# Patient Record
Sex: Female | Born: 1975 | Race: Black or African American | Hispanic: No | Marital: Married | Smoking: Never smoker
Health system: Southern US, Community
[De-identification: ages and names within clinical notes are randomized; demographics above are authoritative.]

## PROBLEM LIST (undated history)

## (undated) DIAGNOSIS — K5900 Constipation, unspecified: Secondary | ICD-10-CM

## (undated) DIAGNOSIS — M2550 Pain in unspecified joint: Secondary | ICD-10-CM

## (undated) DIAGNOSIS — E785 Hyperlipidemia, unspecified: Secondary | ICD-10-CM

## (undated) DIAGNOSIS — M549 Dorsalgia, unspecified: Secondary | ICD-10-CM

## (undated) DIAGNOSIS — I10 Essential (primary) hypertension: Secondary | ICD-10-CM

## (undated) DIAGNOSIS — R7303 Prediabetes: Secondary | ICD-10-CM

## (undated) DIAGNOSIS — R0602 Shortness of breath: Secondary | ICD-10-CM

## (undated) DIAGNOSIS — G5602 Carpal tunnel syndrome, left upper limb: Secondary | ICD-10-CM

## (undated) DIAGNOSIS — R12 Heartburn: Secondary | ICD-10-CM

## (undated) HISTORY — PX: OTHER SURGICAL HISTORY: SHX169

## (undated) HISTORY — DX: Heartburn: R12

## (undated) HISTORY — DX: Hyperlipidemia, unspecified: E78.5

## (undated) HISTORY — PX: BREAST SURGERY: SHX581

## (undated) HISTORY — DX: Pain in unspecified joint: M25.50

## (undated) HISTORY — DX: Constipation, unspecified: K59.00

## (undated) HISTORY — DX: Dorsalgia, unspecified: M54.9

## (undated) HISTORY — DX: Prediabetes: R73.03

## (undated) HISTORY — DX: Shortness of breath: R06.02

## (undated) HISTORY — DX: Carpal tunnel syndrome, left upper limb: G56.02

---

## 2000-12-19 ENCOUNTER — Encounter (INDEPENDENT_AMBULATORY_CARE_PROVIDER_SITE_OTHER): Payer: Self-pay

## 2000-12-19 ENCOUNTER — Other Ambulatory Visit: Admission: RE | Admit: 2000-12-19 | Discharge: 2000-12-19 | Payer: Self-pay | Admitting: Obstetrics

## 2001-06-15 ENCOUNTER — Encounter (INDEPENDENT_AMBULATORY_CARE_PROVIDER_SITE_OTHER): Payer: Self-pay

## 2001-06-15 ENCOUNTER — Other Ambulatory Visit: Admission: RE | Admit: 2001-06-15 | Discharge: 2001-06-15 | Payer: Self-pay | Admitting: Obstetrics

## 2003-12-22 ENCOUNTER — Other Ambulatory Visit: Admission: RE | Admit: 2003-12-22 | Discharge: 2003-12-22 | Payer: Self-pay | Admitting: Obstetrics & Gynecology

## 2005-11-11 ENCOUNTER — Encounter (INDEPENDENT_AMBULATORY_CARE_PROVIDER_SITE_OTHER): Payer: Self-pay | Admitting: *Deleted

## 2005-11-11 ENCOUNTER — Ambulatory Visit (HOSPITAL_COMMUNITY): Admission: AD | Admit: 2005-11-11 | Discharge: 2005-11-11 | Payer: Self-pay | Admitting: Obstetrics and Gynecology

## 2005-12-24 ENCOUNTER — Ambulatory Visit (HOSPITAL_COMMUNITY): Admission: RE | Admit: 2005-12-24 | Discharge: 2005-12-24 | Payer: Self-pay | Admitting: Obstetrics & Gynecology

## 2006-01-24 ENCOUNTER — Emergency Department (HOSPITAL_COMMUNITY): Admission: EM | Admit: 2006-01-24 | Discharge: 2006-01-24 | Payer: Self-pay | Admitting: Family Medicine

## 2006-01-31 ENCOUNTER — Ambulatory Visit (HOSPITAL_COMMUNITY): Admission: RE | Admit: 2006-01-31 | Discharge: 2006-01-31 | Payer: Self-pay | Admitting: Obstetrics & Gynecology

## 2006-01-31 ENCOUNTER — Encounter (INDEPENDENT_AMBULATORY_CARE_PROVIDER_SITE_OTHER): Payer: Self-pay | Admitting: *Deleted

## 2006-03-14 ENCOUNTER — Ambulatory Visit (HOSPITAL_COMMUNITY): Admission: RE | Admit: 2006-03-14 | Discharge: 2006-03-14 | Payer: Self-pay | Admitting: Obstetrics & Gynecology

## 2006-05-16 ENCOUNTER — Encounter (INDEPENDENT_AMBULATORY_CARE_PROVIDER_SITE_OTHER): Payer: Self-pay | Admitting: Specialist

## 2006-05-16 ENCOUNTER — Ambulatory Visit (HOSPITAL_COMMUNITY): Admission: RE | Admit: 2006-05-16 | Discharge: 2006-05-17 | Payer: Self-pay | Admitting: Obstetrics & Gynecology

## 2007-11-28 ENCOUNTER — Inpatient Hospital Stay (HOSPITAL_COMMUNITY): Admission: AD | Admit: 2007-11-28 | Discharge: 2007-12-01 | Payer: Self-pay | Admitting: Obstetrics

## 2007-11-28 ENCOUNTER — Encounter (INDEPENDENT_AMBULATORY_CARE_PROVIDER_SITE_OTHER): Payer: Self-pay | Admitting: Obstetrics

## 2007-12-06 ENCOUNTER — Inpatient Hospital Stay (HOSPITAL_COMMUNITY): Admission: AD | Admit: 2007-12-06 | Discharge: 2007-12-08 | Payer: Self-pay | Admitting: Obstetrics & Gynecology

## 2008-04-08 ENCOUNTER — Emergency Department (HOSPITAL_COMMUNITY): Admission: EM | Admit: 2008-04-08 | Discharge: 2008-04-08 | Payer: Self-pay | Admitting: Family Medicine

## 2008-06-15 ENCOUNTER — Inpatient Hospital Stay (HOSPITAL_COMMUNITY): Admission: AD | Admit: 2008-06-15 | Discharge: 2008-06-16 | Payer: Self-pay | Admitting: Obstetrics & Gynecology

## 2009-06-10 ENCOUNTER — Emergency Department (HOSPITAL_COMMUNITY): Admission: EM | Admit: 2009-06-10 | Discharge: 2009-06-10 | Payer: Self-pay | Admitting: Emergency Medicine

## 2010-01-05 ENCOUNTER — Emergency Department (HOSPITAL_COMMUNITY): Admission: EM | Admit: 2010-01-05 | Discharge: 2010-01-05 | Payer: Self-pay | Admitting: Emergency Medicine

## 2011-03-17 LAB — BASIC METABOLIC PANEL
BUN: 14 mg/dL (ref 6–23)
CO2: 23 mEq/L (ref 19–32)
Calcium: 10.2 mg/dL (ref 8.4–10.5)
Chloride: 101 mEq/L (ref 96–112)
Creatinine, Ser: 0.83 mg/dL (ref 0.4–1.2)
GFR calc Af Amer: >60 mL/min (ref >60)
GFR calc non Af Amer: >60 mL/min (ref >60)
Glucose, Bld: 146 mg/dL — ABNORMAL HIGH (ref 70–99)
Potassium: 3.7 mEq/L (ref 3.5–5.1)
Sodium: 137 mEq/L (ref 135–145)

## 2011-03-17 LAB — CBC
HCT: 46.8 % — ABNORMAL HIGH (ref 36.0–46.0)
Hemoglobin: 16.1 g/dL — ABNORMAL HIGH (ref 12.0–15.0)
MCHC: 34.5 g/dL (ref 30.0–36.0)
MCV: 85.7 fL (ref 78.0–100.0)
Platelets: 294 10*3/uL (ref 150–400)
RBC: 5.46 MIL/uL — ABNORMAL HIGH (ref 3.87–5.11)
RDW: 13.4 % (ref 11.5–15.5)
WBC: 14.3 10*3/uL — ABNORMAL HIGH (ref 4.0–10.5)

## 2011-03-17 LAB — GLUCOSE, CAPILLARY: Glucose-Capillary: 137 mg/dL — ABNORMAL HIGH (ref 70–99)

## 2011-03-17 LAB — DIFFERENTIAL
Basophils Absolute: 0 10*3/uL (ref 0.0–0.1)
Basophils Relative: 0 % (ref 0–1)
Eosinophils Absolute: 0.1 10*3/uL (ref 0.0–0.7)
Eosinophils Relative: 1 % (ref 0–5)
Lymphocytes Relative: 5 % — ABNORMAL LOW (ref 12–46)
Lymphs Abs: 0.7 10*3/uL (ref 0.7–4.0)
Monocytes Absolute: 0.6 10*3/uL (ref 0.1–1.0)
Monocytes Relative: 5 % (ref 3–12)
Neutro Abs: 12.9 10*3/uL — ABNORMAL HIGH (ref 1.7–7.7)
Neutrophils Relative %: 90 % — ABNORMAL HIGH (ref 43–77)

## 2011-04-08 LAB — POCT INFECTIOUS MONO SCREEN: Mono Screen: NEGATIVE

## 2011-04-08 LAB — POCT RAPID STREP A (OFFICE): Streptococcus, Group A Screen (Direct): NEGATIVE

## 2011-04-10 ENCOUNTER — Other Ambulatory Visit: Payer: Self-pay | Admitting: Obstetrics & Gynecology

## 2011-05-14 NOTE — Op Note (Signed)
NAMECLAUDE, [name]       ACCOUNT NO.:  [id number]   MEDICAL RECORD NO.:  [id number]          PATIENT TYPE:  INP   LOCATION:  9198                          FACILITY:  [facility]   PHYSICIAN:  Lendon Colonel, MD   DATE OF BIRTH:  12/06/1976   DATE OF PROCEDURE:  11/28/2007  DATE OF DISCHARGE:                               OPERATIVE REPORT   PREOPERATIVE DIAGNOSES:  Term pregnancy, prior myomectomy, preeclampsia.   POSTOPERATIVE DIAGNOSES:  Term pregnancy, prior myomectomy,  preeclampsia.   PROCEDURE:  Primary low transverse cesarean section.   SURGEON:  Alphonsus Sias. Algie Coffer, MD.   ASSISTANT:  __________ .   ANESTHESIA:  Spinal.   FINDINGS:  A 9 pound healthy female in the lower uterine segment, normal  tubes and ovaries, myomectomy scars apparent on the uterus.   SPECIMENS:  Placenta to Pathology.   ESTIMATED BLOOD LOSS:  700 ml.   COMPLICATIONS:  None.   PROCEDURE:  After informed consent was obtained, the patient was taken  to the operating room where a spinal anesthesia was administered without  difficulty.  She was prepped and draped in the normal sterile fashion in  the dorsal supine lithotomy position.  A Foley catheter was inserted  into the bladder.  A Pfannenstiel skin incision was made 2 cm above the  pubic symphysis in the midline with the scalpel and carried through to  the underlying layer of fascia with the Bovie cautery.  The fascia was  incised in the midline, and the incision was extended laterally with the  Mayo scissors.  The inferior aspect of the fascial incision was grasped  with Kocher clamps, elevated, and the underlying rectus muscles were  dissected off sharply. In a similar fasion, the superior aspect of the  fascial incision, was dissected off the underlying rectus muscles. The  fascia was grasped with Kocher clamps and elevated up and the underlying  rectus muscles dissected sharply.  The peritoneum was identified and  entered bluntly.  The  peritoneal incision was extended superiorly and  inferiorly with good visualization of the bladder.  The bladder blade  was inserted, the peritoneum identified, grasped with the pickups, and  entered sharply with the Metzenbaum scissors.  The incision was entered  laterally, and the bladder flap was created digitally, taken down  without complication.  The bladder blade was then inserted and the lower  uterine segment incised with the scalpel. The uterine incision was  extended superiorly and inferiorly with the bandage scissors.  The  infants occiput was delivered without complication, the cord was clamped  and cut, and the infant was handed to the awaiting pediatrician.  The  placenta was removed by manual extraction.  The uterus was exteriorized  and cleared of all clots and debris.  A 3 cm inferior extension was  noted and repaired primarily in a running locked fashion.  The uterine  incision was repaired with #0 Vicryl in a locked fashion. The extension  was then imbricated.  This imbricating suture was carried along the  right aspect of the uterine incision with good hemostasis.  Several  additional figure-of-eight sutures were used on the  left side of the  incision and along the extension.  There was good hemostasis noted.  The  uterus was returned to the abdomen.  The gutters were cleared of all  clots and debris.  The uterine incision was then inspected and found to  be hemostatic.  The peritoneum was closed with #2-0 Vicryl.  The rectus  muscle and halves.  The subcutaneous tissue was irrigated, several small  capillary bleeders were Bovie cauterized for hemostasis, and the skin  was closed with staples.  The patient tolerated the procedure well.  Sponge, lap, and needle counts were correct x3, and the patient was  taken to the recovery room in stable condition.      Lendon Colonel, MD  Electronically Signed     KAF/MEDQ  D:  11/28/2007  T:  11/28/2007  Job:  [id number]

## 2011-05-17 NOTE — Discharge Summary (Signed)
[name], RUBERG       ACCOUNT NO.:  [id number]   MEDICAL RECORD NO.:  [id number]          PATIENT TYPE:  INP   LOCATION:  9307                          [facility]ACILITY:  [facility]   PHYSICIAN:  Genia Del, M.D.DATE OF BIRTH:  [birth year]   DATE OF ADMISSION:  12/05/2007  DATE OF DISCHARGE:  12/08/2007                               DISCHARGE SUMMARY   ADMISSION DIAGNOSIS:  Post-partum preeclampsia.   DISCHARGE DIAGNOSIS:  Post-partum preeclampsia.   HOSPITAL COURSE:  The patient was admitted with blood pressures between  198 to 199/98 to 118. She was already on antihypertensive medication.  She had PIH labs done which were mildly improved since the most recent  one done at the office, and she was started on magnesium sulfate. Her  antihypertensive medications were increased with labetalol increased to  300 mg t.i.d., and her hydrochlorothiazide maintained at 25 mg daily.  She did well on those medication changes, and her blood pressures on  discharge were 123 to 134/75 to 82. She had no symptoms or signs of  preeclampsia, and her magnesium was stopped.   We discharged her on the same medications which were labetalol 300  t.i.d. and hydrochlorothiazide 25 daily. She was to follow up at  [facility] on next Monday with blood pressure check.      Genia Del, M.D.  Electronically Signed     ML/MEDQ  D:  01/04/2008  T:  01/04/2008  Job:  [id number]

## 2011-05-17 NOTE — Op Note (Signed)
[name], [name]        ACCOUNT NO.:  [id number]   MEDICAL RECORD NO.:  [id number]          PATIENT TYPE:  OIB   LOCATION:  1511                         FACILITY:  [facility]   PHYSICIAN:  Genia Del, M.D.DATE OF BIRTH:  01-Aug-1976   DATE OF PROCEDURE:  05/16/2006  DATE OF DISCHARGE:                                 OPERATIVE REPORT   PREOPERATIVE DIAGNOSES:  1.  Primary infertility with history of spontaneous abortion.  2.  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   POSTOPERATIVE DIAGNOSES:  1.  Primary infertility with history of spontaneous abortion.  2.  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   INTERVENTION:  Myomectomies by laparoscopy assisted with Freight forwarder.   SURGEON:  Genia Del, M.D.   ASSISTANT:  Pershing Cox, M.D.   ANESTHESIOLOGIST:  Jenelle Mages. Fortune, M.D.   DESCRIPTION OF PROCEDURE:  Under general anesthesia and endotracheal  intubation with a nasogastric tube, the patient is in lithotomy position.  She is prepped over the abdominal suprapubic, vulvar and vaginal areas and  draped as usual. A bladder catheter is inserted with a Foley. We then did a  vaginal exam revealing an anteverted uterus, increased in volume with a  large fundal myoma. No adnexal mass. The uterus is mobile. It measures about  10-12 cm. We put the speculum inside the vagina, the anterior lip of the  cervix is grasped with a tenaculum. The hysterometry is at 10 cm. We then  dilate the cervix with Hegar dilators easily up to #21. We then insert a #10  intrauterine cannula with the Zumi manipulator. We then go to abdominal  time. We measure the distance for the camera port at 23 cm from the pubic  symphysis which is about 4-5 cm above the umbilicus. We infiltrate the  subcutaneous tissue with Marcaine 0.25% plain and make an incision over 1.5  cm with a scalpel. We use an open procedure using the Hasson. A pursestring  stitch of #0  Vicryl is done in the aponeurosis. We insert the camera and  create a pneumoperitoneum with CO2. Visualization of the abdominal cavity is  normal. The pelvic cavity presents a uterus with a large anterofundal and  posterior myoma that measures at least 8 cm and is intramural. A smaller  fibroid is present on the right fundus measuring about 2 cm in diameter and  a less than 1 cm myoma is present at the fundus more medially. Those two are  subserosal. The two tubes and two ovaries are normal to inspection, no other  pathology is seen in the pelvis. We then measure the location of the other  ports. Three 8 mm robotic ports are put in place and the assistant port is a  10-12 cm trocar. We then proceed with docking of the robot which is done  easily. We put the robotic instruments in and start on the console. We  inject the subserosal tissue with Pitressin along the line where the serosa  and myoma will be incised which is anterior and fundal over about 6 cm. We  use the shear scissors with monopolar current and the  [place] with bipolar  current to make that incision and to remove the largest myoma. The tenaculum  is also used for traction and counter traction to proceed with the  myomectomy. We then partially resect the less than 1 cm myoma and cauterize  it with the [place] for what is left. The myometrial incision is then  repaired with #0 Vicryl. We do it in three planes. The first plane is done  deeply with figure-of-eights all along the incision and then a second layer  figure-of-eights is done bringing the superficial myoma together and finally  a baseball stitch suture with #0 Vicryl is done to close the serosa.  Hemostasis is adequate at that level. We then proceed with the third  myomectomy on the right fundal aspect of the uterus. Pitressin is injected  at that level as well. We then make an incision with the monopolar scissors  over about 1-2 cm and proceed with the resection of  the myoma using  traction, counter traction, the monopolar scissors and the [place] bipolar.  We then close that incision with two figure-of-eight stitches of #0 Vicryl.  Hemostasis is adequate at all levels on the uterus. We irrigate and suction  the abdominopelvic cavity. We then decide to undock the robot to finish the  surgery with the morcellator by laparoscopy. The morcellator is inserted in  the right assistant port. We morcellate and remove all myoma. We irrigate  and suction again. We then use Tisseel on all sutures on the uterus.  Hemostasis is adequate at all levels. The instruments are therefore removed.  The incisions are closed with a 4-0 Vicryl at all incisions on the  subcutaneous tissue. The supraumbilical incision had a #0 Vicryl pursestring  stitch on the aponeurosis that was attached and Dermabond is then used to  reapproximate the skin on all incisions. The instrument in the vagina is  removed. The estimated blood loss was 150 mL. The patient received Ancef 1  gram IV at induction and she was wearing PAS to the thigh. No complication  occurred and the patient was transferred to the recovery room in good  status.      Genia Del, M.D.  Electronically Signed     ML/MEDQ  D:  05/16/2006  T:  05/17/2006  Job:  [id number]

## 2011-05-17 NOTE — Op Note (Signed)
[name], [name]       ACCOUNT NO.:  [id number]   MEDICAL RECORD NO.:  [id number]          PATIENT TYPE:  AMB   LOCATION:  SDC                           FACILITY:  [facility]   PHYSICIAN:  Lenoard Aden, M.D.DATE OF BIRTH:  01-04-1976   DATE OF PROCEDURE:  11/11/2005  DATE OF DISCHARGE:                                 OPERATIVE REPORT   PREOPERATIVE DIAGNOSIS:  Incomplete AB.   POSTOPERATIVE DIAGNOSIS:  Incomplete AB.   PROCEDURE:  Suction D&E.   SURGEON:  Dr. Billy Coast   ANESTHESIA:  MAC paracervical.   ESTIMATED BLOOD LOSS:  50 mL.   COMPLICATIONS:  None.   DRAINS:  None.   COUNTS:  Correct.   DISPOSITION:  The patient to recovery in good condition.   BRIEF OPERATIVE NOTE:  After being apprised of the risks of anesthesia,  infection, bleeding, injury of abdominal organs, __________ complications to  include bowel and bladder injury are noted, the patient is brought to the  operating room where she is administered IV sedation without difficulty,  prepped and draped in usual sterile fashion, catheterized with a red rubber  catheter.  After achieving adequate anesthesia, a paracervical block is  placed using a dilute Xylocaine solution.  At this time, large blood clot is  evacuated from the posterior portion of the vagina.  Cervix is visualized  and grasped using sponge forceps in the anterior lip.  Suction is initiated  using a 9 mm suction curette.  Some placental fragments are extracted and  then using the polyp forceps, a large amount of placenta is removed from an  anterior location along the uterine and endometrial canal.  Repeat suction  curettage confirms cavity to be empty.  Good hemostasis is noted.  Uterus is  palpably at about 12-14 weeks due to known uterine fibroid.  No adnexal  masses are appreciated.  Bleeding is reinspected and found to be within  normal limits.  The patient is transferred to recovery in good condition.      Lenoard Aden,  M.D.  Electronically Signed    RJT/MEDQ  D:  11/11/2005  T:  11/11/2005  Job:  [id number]

## 2011-05-17 NOTE — Discharge Summary (Signed)
NAMEDEQUITA, SCHLEICHER       ACCOUNT NO.:  [id number]   MEDICAL RECORD NO.:  [id number]          PATIENT TYPE:  INP   LOCATION:  9102                          [facility]AC[facility]  [facility]   PHYSICIAN:  Lendon Colonel, MD   DATE OF BIRTH:  [birth year]   DATE OF ADMISSION:  11/28/2007  DATE OF DISCHARGE:  12/01/2007                               DISCHARGE SUMMARY   CHIEF COMPLAINT:  Contractions.   HISTORY OF PRESENT ILLNESS:  This 35 year old G2, P0-0-1-0, at 39 weeks  and 1 day, presents with contractions starting this morning.  Patient  also noted some hematuria.  In MAU, her blood pressure was noted to be  160/100.  Her prenatal issues are significant for a history of  myomectomy with a plan on primary cesarean section, an elevated diabetic  screen with a single elevated 3-hour GTT, size greater than dates with  uterus recent ultrasound putting the baby at greater than the 90th  percentile and a history of pregnancy-induced hypertension with normal  preeclamptic labs.  Patient was noted to be GBS positive and to have a  history of uterine fibroids.   ALLERGIES:  IBUPROFEN.   MEDICATIONS:  Prenatal vitamins.   LABORATORIES:  Prenatal labs were otherwise unremarkable.   PHYSICAL EXAMINATION AT TIME OF ADMISSION:  VITAL SIGNS:  Noted  contractions to be every 3 minutes with reactive fetal heart tones, BP  of 160/100.  EXTREMITIES:  There was 2+ lower extremity edema, 3+ PTRs.  ABDOMEN:  No right upper quadrant tenderness.  PELVIC:  Her cervix was noted to be 1.5 cm dilated, 90% effaced with  gross rupture.   HOSPITAL COURSE:  Patient was admitted for primary cesarean section  secondary to labor at term and history of prior myomectomy.  The  operative note can be found in a separate dictation.  On postoperative  day #1, patient had continuing elevations in her blood pressures up to  systolics of 170s and diastolics of 102.  She was afebrile without  symptoms.  Her labs were notable  for elevation in her transaminases, AST  of 67, ALT of 68, creatinine 0.8 and a platelet level of 120.  This is  improvement from her antepartum labs.  She was continued on p.o.  labetalol.  By postoperative day #3, patient's labs had improved and  trending in the right direction.  She had no signs or symptoms of  preeclampsia, though she did have slightly elevated blood pressures with  systolics in the 130s-140s, diastolics 70s-90s.   PLAN:  Discharge the patient home with follow-up blood pressure check in  2 weeks.  Patient will remain on labetalol at home.   DISCHARGE DIAGNOSES:  Preeclampsia, status post primary low transverse  cesarean section, history of myomectomy.   DISCHARGE CONDITION:  Stable.   DISCHARGE MEDICATIONS:  Include labetalol, Colace, Percocet and Motrin.   DISCHARGE INSTRUCTIONS:  Follow up in the office in 2 weeks for blood  pressure check.      Lendon Colonel, MD  Electronically Signed     KAF/MEDQ  D:  12/20/2007  T:  12/21/2007  Job:  [id number]

## 2011-05-17 NOTE — Op Note (Signed)
NAME:  [name], [name]       ACCOUNT NO.:  [id number]   MEDICAL RECORD NO.:  [id number]          PATIENT TYPE:  AMB   LOCATION:  SDC                           [facility]ILITY:  [facility]   PHYSICIAN:  Genia Del, M.D.DATE OF BIRTH:  1976-07-19   DATE OF PROCEDURE:  01/31/2006  DATE OF DISCHARGE:                                 OPERATIVE REPORT   PREOPERATIVE DIAGNOSIS:  Submucosal myoma with history of  spontaneous  abortion.   POSTOPERATIVE DIAGNOSIS:  Submucosal myoma with history of  spontaneous  abortion with irregular intrauterine cavity.   OPERATION/PROCEDURE:  Hysteroscopy with resection of intrauterine lesion and  dilatation and curettage.   SURGEON:  Genia Del, M.D.   ANESTHESIOLOGIST:  Raul Del, M.D.   DESCRIPTION OF PROCEDURE:  Under general anesthesia with laryngeal mask, the  patient is in lithotomy position. She is prepped with Betadine on the  suprapubic, vulvar and vaginal area and draped as usual.  The bladder is  catheterized.  The vaginal exam revealed an anteverted uterus, irregular,  mobile,  no adnexal mass.  The cervix is long and closed. No vaginal  bleeding.  The speculum is inserted in the vagina.  The anterior lip of the  cervix is grasped with a tenaculum. A paracervical block was done with  Nesacaine 1%, 20 mL total at 4 and 8 o'clock.  We proceeded with dilatation  of the cervix after doing hysterometry at 7 cm. We dilated the cervix with  Hegar dilators up to #31 without difficulty.  We then inserted a  resectoscope in the intrauterine cavity.  We visualized the intrauterine  cavity.  It is very irregular with either thick, irregular endometrium or  degenerating tissue, myomas or left tissues from past miscarriage. We also  visualized  on the right anterior wall a probable submucosal myoma.  Pictures are taken. We then proceed with resection of that myoma. We remove  also the very irregular tissue which is present throughout the  intrauterine  cavity and send it for pathology.  We then proceed with a systematic  curettage over the intrauterine cavity with a sharp curet and that is sent  separately to pathology.  We then revisualized the intrauterine cavity which  does not present any clearcut myoma to resect but just generalized  irregularity of tissue.  Given that the fluid deficit is now at 940, the  decision is taken to remove instruments and await for pathology results.  All instruments are removed.  Hemostasis is adequate.  The estimated blood  loss was minimal.  The fluid deficit was 940 mL.  No complications occurred  and the patient was brought to the recovery room in good status.      Genia Del, M.D.  Electronically Signed     ML/MEDQ  D:  01/31/2006  T:  01/31/2006  Job:  [id number]

## 2011-10-07 LAB — CBC
HCT: 25.1 — ABNORMAL LOW
HCT: 25.5 — ABNORMAL LOW
HCT: 30.6 — ABNORMAL LOW
HCT: 33.4 — ABNORMAL LOW
Hemoglobin: 10.6 — ABNORMAL LOW
Hemoglobin: 11.4 — ABNORMAL LOW
Hemoglobin: 8.6 — ABNORMAL LOW
Hemoglobin: 8.7 — ABNORMAL LOW
MCHC: 33.8
MCHC: 34
MCHC: 34.7
MCHC: 34.7
MCV: 90
MCV: 90.2
MCV: 90.3
MCV: 90.9
Platelets: 118 — ABNORMAL LOW
Platelets: 123 — ABNORMAL LOW
Platelets: 299
Platelets: 329
RBC: 2.78 — ABNORMAL LOW
RBC: 2.81 — ABNORMAL LOW
RBC: 3.39 — ABNORMAL LOW
RBC: 3.71 — ABNORMAL LOW
RDW: 14.4
RDW: 14.7
RDW: 14.7
RDW: 14.8
WBC: 13.1 — ABNORMAL HIGH
WBC: 7.9
WBC: 8.9
WBC: 9.7

## 2011-10-07 LAB — COMPREHENSIVE METABOLIC PANEL
ALT: 34
ALT: 42 — ABNORMAL HIGH
AST: 24
AST: 32
Albumin: 3 — ABNORMAL LOW
Albumin: 3.1 — ABNORMAL LOW
Alkaline Phosphatase: 128 — ABNORMAL HIGH
Alkaline Phosphatase: 164 — ABNORMAL HIGH
BUN: 10
BUN: 11
CO2: 26
CO2: 26
Calcium: 8.3 — ABNORMAL LOW
Calcium: 9.2
Chloride: 102
Chloride: 103
Creatinine, Ser: 0.71
Creatinine, Ser: 0.76
GFR calc Af Amer: >60
GFR calc Af Amer: >60
GFR calc non Af Amer: >60
GFR calc non Af Amer: >60
Glucose, Bld: 95
Glucose, Bld: 97
Potassium: 3.9
Potassium: 4.1
Sodium: 135
Sodium: 136
Total Bilirubin: 0.5
Total Bilirubin: 0.6
Total Protein: 5.9 — ABNORMAL LOW
Total Protein: 6.2

## 2011-10-07 LAB — LACTATE DEHYDROGENASE
LDH: 212
LDH: 218

## 2011-10-07 LAB — CREATININE, SERUM
Creatinine, Ser: 0.76
GFR calc Af Amer: >60
GFR calc non Af Amer: >60

## 2011-10-07 LAB — URIC ACID
Uric Acid, Serum: 5.7
Uric Acid, Serum: 6

## 2011-10-07 LAB — AST: AST: 37

## 2011-10-07 LAB — ALT: ALT: 44 — ABNORMAL HIGH

## 2011-10-08 LAB — CBC
HCT: 35.8 — ABNORMAL LOW
Hemoglobin: 12.4
MCHC: 34.2
MCV: 89.3
MCV: 90
RBC: 3.33 — ABNORMAL LOW
WBC: 10.2

## 2011-10-08 LAB — COMPREHENSIVE METABOLIC PANEL
Albumin: 2.8 — ABNORMAL LOW
Alkaline Phosphatase: 237 — ABNORMAL HIGH
BUN: 8
CO2: 22
Chloride: 108
GFR calc non Af Amer: >60
Glucose, Bld: 79
Potassium: 3.7
Total Bilirubin: 0.5

## 2011-10-08 LAB — CREATININE, SERUM: GFR calc Af Amer: >60

## 2011-10-08 LAB — TYPE AND SCREEN

## 2011-10-08 LAB — RPR: RPR Ser Ql: NONREACTIVE

## 2011-10-08 LAB — DIC (DISSEMINATED INTRAVASCULAR COAGULATION)PANEL
Fibrinogen: 469
INR: 0.9
Smear Review: NONE SEEN
aPTT: 26

## 2011-10-08 LAB — LACTATE DEHYDROGENASE: LDH: 149

## 2013-10-26 ENCOUNTER — Other Ambulatory Visit (HOSPITAL_COMMUNITY): Payer: Self-pay | Admitting: Internal Medicine

## 2013-10-26 ENCOUNTER — Ambulatory Visit (HOSPITAL_COMMUNITY)
Admission: RE | Admit: 2013-10-26 | Discharge: 2013-10-26 | Disposition: A | Discharge status: Home / Self Care | Payer: 59 | Source: Ambulatory Visit | Attending: Internal Medicine | Admitting: Internal Medicine

## 2013-10-26 DIAGNOSIS — J329 Chronic sinusitis, unspecified: Secondary | ICD-10-CM

## 2014-04-09 ENCOUNTER — Emergency Department (HOSPITAL_COMMUNITY): Payer: 59

## 2014-04-09 ENCOUNTER — Emergency Department (HOSPITAL_COMMUNITY)
Admission: EM | Admit: 2014-04-09 | Discharge: 2014-04-09 | Disposition: A | Discharge status: Home / Self Care | Payer: 59 | Attending: Emergency Medicine | Admitting: Emergency Medicine

## 2014-04-09 ENCOUNTER — Encounter (HOSPITAL_COMMUNITY): Payer: Self-pay | Admitting: Emergency Medicine

## 2014-04-09 DIAGNOSIS — K208 Other esophagitis without bleeding: Secondary | ICD-10-CM

## 2014-04-09 DIAGNOSIS — I10 Essential (primary) hypertension: Secondary | ICD-10-CM | POA: Insufficient documentation

## 2014-04-09 DIAGNOSIS — Z79899 Other long term (current) drug therapy: Secondary | ICD-10-CM | POA: Insufficient documentation

## 2014-04-09 DIAGNOSIS — T50905A Adverse effect of unspecified drugs, medicaments and biological substances, initial encounter: Secondary | ICD-10-CM

## 2014-04-09 DIAGNOSIS — R1310 Dysphagia, unspecified: Secondary | ICD-10-CM

## 2014-04-09 DIAGNOSIS — K209 Esophagitis, unspecified without bleeding: Secondary | ICD-10-CM | POA: Insufficient documentation

## 2014-04-09 DIAGNOSIS — IMO0002 Reserved for concepts with insufficient information to code with codable children: Secondary | ICD-10-CM | POA: Insufficient documentation

## 2014-04-09 HISTORY — DX: Essential (primary) hypertension: I10

## 2014-04-09 MED ORDER — OMEPRAZOLE 40 MG PO CPDR: DELAYED_RELEASE_CAPSULE | ORAL | Status: DC

## 2014-04-09 MED ORDER — GI COCKTAIL ~~LOC~~
30.0000 mL | Freq: Once | ORAL | Status: AC
  Administered 2014-04-09: 30 mL via ORAL
  Filled 2014-04-09: qty 30

## 2014-04-09 MED ORDER — NITROGLYCERIN 0.4 MG SL SUBL
0.4000 mg | SUBLINGUAL_TABLET | SUBLINGUAL | Status: DC | PRN
  Administered 2014-04-09: 0.4 mg via SUBLINGUAL
  Filled 2014-04-09: qty 1

## 2014-04-09 MED ORDER — NITROGLYCERIN 0.4 MG SL SUBL: 0.4000 mg | SUBLINGUAL_TABLET | SUBLINGUAL | Status: DC | PRN

## 2014-04-09 NOTE — ED Notes (Signed)
Pt states she took 2tabs of  tylenol and feels like it is lodged in her throat. Pt denies difficulty swallowing. Pt feels pain when swallowing, also feels hard to breathe. Pt states she tried to make herself vomit the pill up, but was not able to.

## 2014-04-09 NOTE — Discharge Instructions (Signed)
Eat soft foods and avoid sharp foods (like chips) and if you eat solid foods, chew it well. Use the Nitroglycerin for chest pain. Take the prilosec twice a day for the next 2 weeks then once daily. You can also try TUMS, maalox for comfort. Call Dr Kelby Fam office if you aren't feeling better over the weekend. Return to the ED if you can't swallow, have difficulty breathing or feel worse.

## 2014-04-09 NOTE — ED Notes (Signed)
Dr. Tomi Bamberger confirmed with Radiologist pt was approved for DG Esphagus.    Radiologist [phone]

## 2014-04-09 NOTE — ED Notes (Signed)
Pt states she took 2 Tylenol last night and ever since has felt as if the pills are stuck in her throat.  Pt states "It is painful to swallow and I am having to swallow slower".  Pt is maintaining control of her saliva.  100% room air.

## 2014-04-09 NOTE — ED Provider Notes (Signed)
CSN: [id number]     Arrival date & time 04/09/14  7408 History   First MD Initiated Contact with Patient 04/09/14 1006     Chief Complaint  Patient presents with  . Sore Throat    Pt feels that pill is lodged in throat     (Consider location/radiation/quality/duration/timing/severity/associated sxs/prior Treatment) HPI Patient reports yesterday midmorning she was swallowing 2 Tylenol pills. She felt the first pill went down fine however the second pill didn't go down and she felt like it got stuck. She denies having a choking episode. She states she ate bread and drink a lot of water to try to help with mood down. She points to the area behind her thyroid cartilage as to where she feels that pill was stuck. She however relates when she swallows she has pain in her mid chest area. She states now she eats or drinks it's painful as the food goes down and is going down slower usual. She denies any nausea or vomiting. She's not had difficulty before.  PCP Dr Osborne Casco  Past Medical History  Diagnosis Date  . Hypertension    Past Surgical History  Procedure Laterality Date  . Cesarean section     History reviewed. No pertinent family history. History  Substance Use Topics  . Smoking status: Never Smoker   . Smokeless tobacco: Not on file  . Alcohol Use: No   Employed as a Marine scientist  OB History   Grav Para Term Preterm Abortions TAB SAB Ect Mult Living                 Review of Systems  All other systems reviewed and are negative.     Allergies  Review of patient's allergies indicates no known allergies.  Home Medications   Current Outpatient Rx  Name  Route  Sig  Dispense  Refill  . hydrochlorothiazide (HYDRODIURIL) 25 MG tablet   Oral   Take 25 mg by mouth daily.         . mometasone (NASONEX) 50 MCG/ACT nasal spray   Nasal   Place 2 sprays into the nose daily.         . montelukast (SINGULAIR) 10 MG tablet   Oral   Take 10 mg by mouth daily.          BP  132/88  Pulse 85  Temp(Src) 98 F (36.7 C) (Oral)  Resp 16  Ht 5\' 7"  (1.702 m)  Wt 194 lb (87.998 kg)  BMI 30.38 kg/m2  SpO2 100%  LMP 03/15/2014  Vital signs normal   Physical Exam  Nursing note and vitals reviewed. Constitutional: She is oriented to person, place, and time. She appears well-developed and well-nourished.  Non-toxic appearance. She does not appear ill. No distress.  HENT:  Head: Normocephalic and atraumatic.  Right Ear: External ear normal.  Left Ear: External ear normal.  Nose: Nose normal. No mucosal edema or rhinorrhea.  Mouth/Throat: Oropharynx is clear and moist and mucous membranes are normal. No dental abscesses or uvula swelling.  Eyes: Conjunctivae and EOM are normal. Pupils are equal, round, and reactive to light.  Neck: Normal range of motion and full passive range of motion without pain. Neck supple.    She feels this area is where the pill was stuck  Cardiovascular: Normal rate, regular rhythm and normal heart sounds.  Exam reveals no gallop and no friction rub.   No murmur heard. Pulmonary/Chest: Effort normal and breath sounds normal. No respiratory distress. She has  no wheezes. She has no rhonchi. She has no rales. She exhibits no tenderness and no crepitus.    Pt has pain here when she swallows  Abdominal: Normal appearance.  Musculoskeletal: Normal range of motion.  Moves all extremities well.   Neurological: She is alert and oriented to person, place, and time. She has normal strength. No cranial nerve deficit.  Skin: Skin is warm, dry and intact. No rash noted. No erythema. No pallor.  Psychiatric: She has a normal mood and affect. Her speech is normal and behavior is normal. Her mood appears not anxious.    ED Course  Procedures (including critical care time)  Medications  nitroGLYCERIN (NITROSTAT) SL tablet 0.4 mg (0.4 mg Sublingual Given 04/09/14 1341)  gi cocktail (Maalox,Lidocaine,Donnatal) (30 mLs Oral Given 04/09/14 1036)    Recheck at 1100 patient states minimal relief with GI cocktail still feels like she has something blocking her swallowing. We discussed doing a volume study in x-ray and she is agreeable.  11:30 AM I suspect to the radiologist about getting the x-ray study and they are agreeable.  12:38 Dr Radford Pax, Radiology, states the barium is passing well, but has some delay at the distal esophagus  12:49 Dr Deatra Ina states she most likely has pill esophagitis, he relates to try sublingual nitroglycerin for her chest pain. He also suggest putting her on PPIs twice a day. He states to have her give it 40-3 days to see if her symptoms improve if not he can see her in the office.  1400 patient has had the nitroglycerin. She states that the pressure off her chest. She denies having any heartburn type symptoms in the past. We reiterated Dr. Kelby Fam instructions. She is going to follow with him on Monday in 2 days if her symptoms were not improving. She's advised most people are improved in 2-3 days.  Labs Review Labs Reviewed - No data to display Imaging Review Dg Esophagus W/water Sol Cm  04/09/2014   CLINICAL DATA:  38 year old patient with painful swallowing after taking a Tylenol last night. The patient feels like the Tylenol is is stuck in the esophagus. The patient has never experienced this symptoms for with food or medicine. She denies any history of gastroesophageal reflux.  EXAM: ESOPHOGRAM/BARIUM SWALLOW  TECHNIQUE: Single contrast examination was performed using water soluble contrast and a 13 mm barium tablet.  FLUOROSCOPY TIME:  2 min and 11 seconds  COMPARISON:  None.  FINDINGS: The pharynx is evaluated with rapid sequence imaging and appears normal. No pharyngeal narrowing or tracheal aspiration is seen. The proximal and mid esophagus demonstrated normal distensibility and peristalsis with the patient in the upright and prone positions. With patient in both positions, the contrast passes from the  esophagus into the stomach without delay. However, there is persistent narrowing of the distal esophagus near the gastroesophageal junction with the patient in both positions.  The patient swallowed a barium tablet in the upright position. By the time the fluoroscopy was initiated after the patient had swallowed, the barium tablet could not be seen and was never identified during the examination. Therefore, the barium tablet presumably traversed the length of the esophagus and into the stomach rapidly and without difficulty.  No gastroesophageal reflux was seen.  IMPRESSION: Persistent narrowing of the distal esophagus near the gastroesophageal junction during the examination. This is not felt to be a fixed narrowing, as the barium tablet did not lodge or persist at the narrowing, but presumably passed rapidly into the stomach after  the patient swallowed it. Question the possibility of distal esophageal spasm or narrowing due to focal esophagitis. If patient's symptoms persist, further evaluation with endoscopy could be considered.   Electronically Signed   By: Curlene Dolphin M.D.   On: 04/09/2014 13:10     EKG Interpretation None      MDM   Final diagnoses:  Dysphagia  Pill esophagitis    New Prescriptions   NITROGLYCERIN (NITROSTAT) 0.4 MG SL TABLET    Place 1 tablet (0.4 mg total) under the tongue every 5 (five) minutes as needed for chest pain.   OMEPRAZOLE (PRILOSEC) 40 MG CAPSULE    Take 1 po BID x 2 weeks then once a day    Plan discharge  Rolland Porter, MD, Alanson Aly, MD 04/09/14 1407

## 2016-02-01 DIAGNOSIS — Z6829 Body mass index (BMI) 29.0-29.9, adult: Secondary | ICD-10-CM | POA: Diagnosis not present

## 2016-02-01 DIAGNOSIS — M5430 Sciatica, unspecified side: Secondary | ICD-10-CM | POA: Diagnosis not present

## 2016-02-01 MED FILL — MELOXICAM 7.5 MG TABLET: 7.5 | 30 days supply | Qty: 60 | Fill #0

## 2016-02-14 MED FILL — predniSONE 10 MG TABS: 10 | 5 days supply | Qty: 20 | Fill #0

## 2016-03-27 DIAGNOSIS — H02821 Cysts of right upper eyelid: Secondary | ICD-10-CM | POA: Diagnosis not present

## 2016-03-27 DIAGNOSIS — H109 Unspecified conjunctivitis: Secondary | ICD-10-CM | POA: Diagnosis not present

## 2016-03-29 MED FILL — BEPREVE 1.5% EYE DROPS: 1.5 | 25 days supply | Qty: 5 | Fill #0

## 2016-04-03 DIAGNOSIS — I10 Essential (primary) hypertension: Secondary | ICD-10-CM | POA: Diagnosis not present

## 2016-04-03 DIAGNOSIS — Z0000 Encounter for general adult medical examination without abnormal findings: Secondary | ICD-10-CM | POA: Diagnosis not present

## 2016-04-03 DIAGNOSIS — R8299 Other abnormal findings in urine: Secondary | ICD-10-CM | POA: Diagnosis not present

## 2016-04-04 ENCOUNTER — Encounter: Payer: Self-pay | Admitting: Obstetrics & Gynecology

## 2016-04-04 DIAGNOSIS — Z803 Family history of malignant neoplasm of breast: Secondary | ICD-10-CM | POA: Diagnosis not present

## 2016-04-04 DIAGNOSIS — Z1231 Encounter for screening mammogram for malignant neoplasm of breast: Secondary | ICD-10-CM | POA: Diagnosis not present

## 2016-04-05 MED FILL — HYDROCHLOROTHIAZIDE 25 MG T: 25 | 90 days supply | Qty: 90 | Fill #0

## 2016-04-05 MED FILL — MELOXICAM 7.5 MG TABLET: 7.5 | 30 days supply | Qty: 60 | Fill #1

## 2016-04-09 MED FILL — MONTELUKAST SOD 10 MG TAB: 10 | 90 days supply | Qty: 90 | Fill #0

## 2016-04-16 DIAGNOSIS — F321 Major depressive disorder, single episode, moderate: Secondary | ICD-10-CM | POA: Diagnosis not present

## 2016-04-16 DIAGNOSIS — Z0000 Encounter for general adult medical examination without abnormal findings: Secondary | ICD-10-CM | POA: Diagnosis not present

## 2016-04-16 DIAGNOSIS — J302 Other seasonal allergic rhinitis: Secondary | ICD-10-CM | POA: Diagnosis not present

## 2016-04-16 DIAGNOSIS — E7800 Pure hypercholesterolemia, unspecified: Secondary | ICD-10-CM | POA: Diagnosis not present

## 2016-04-18 DIAGNOSIS — Z6830 Body mass index (BMI) 30.0-30.9, adult: Secondary | ICD-10-CM | POA: Diagnosis not present

## 2016-04-18 DIAGNOSIS — Z1151 Encounter for screening for human papillomavirus (HPV): Secondary | ICD-10-CM | POA: Diagnosis not present

## 2016-04-18 DIAGNOSIS — Z01419 Encounter for gynecological examination (general) (routine) without abnormal findings: Secondary | ICD-10-CM | POA: Diagnosis not present

## 2016-06-18 DIAGNOSIS — M5136 Other intervertebral disc degeneration, lumbar region: Secondary | ICD-10-CM | POA: Diagnosis not present

## 2016-06-24 ENCOUNTER — Encounter: Payer: Self-pay | Admitting: Physical Therapy

## 2016-06-24 MED FILL — MELOXICAM 7.5 MG TABLET: 7.5 | 30 days supply | Qty: 60 | Fill #2

## 2016-06-25 ENCOUNTER — Encounter: Payer: Self-pay | Admitting: Physical Therapy

## 2016-06-25 ENCOUNTER — Ambulatory Visit: Payer: 59 | Attending: Physical Medicine and Rehabilitation | Admitting: Physical Therapy

## 2016-06-25 DIAGNOSIS — M5441 Lumbago with sciatica, right side: Secondary | ICD-10-CM | POA: Insufficient documentation

## 2016-06-25 DIAGNOSIS — M6281 Muscle weakness (generalized): Secondary | ICD-10-CM | POA: Insufficient documentation

## 2016-06-25 DIAGNOSIS — M25651 Stiffness of right hip, not elsewhere classified: Secondary | ICD-10-CM | POA: Insufficient documentation

## 2016-06-25 NOTE — Patient Instructions (Signed)
Posture Tips DO: - stand tall and erect - keep chin tucked in - keep head and shoulders in alignment - check posture regularly in mirror or large window - pull head back against headrest in car seat;  Change your position often.  Sit with lumbar support. DON'T: - slouch or slump while watching TV or reading - sit, stand or lie in one position  for too long;  Sitting is especially hard on the spine so if you sit at a desk/use the computer, then stand up often!   Copyright  VHI. All rights reserved.  Posture - Standing   Good posture is important. Avoid slouching and forward head thrust. Maintain curve in low back and align ears over shoul- ders, hips over ankles.  Pull your belly button in toward your back bone.  Have weight in toes and heels even.  Lift rib cage up and chin down.   Copyright  VHI. All rights reserved.  Posture - Sitting   Sit upright, head facing forward. Try using a roll to support lower back. Keep shoulders relaxed, and avoid rounded back. Keep hips level with knees. Avoid crossing legs for long periods.  dont perch on edge of seat. Sit on sit bones not tail bones.   Copyright  VHI. All rights reserved.   Piriformis (Supine)  Cross legs, right on top. Gently pull other knee toward chest until stretch is felt in buttock/hip of top leg. Hold _30-60___ seconds. Repeat __2__ times per set. Do __1__ sets per session. Do __2-3__ sessions per day.  Hip Stretch  Put right ankle over left knee. Let right knee fall downward, but keep ankle in place. Feel the stretch in hip. May push down gently with hand to feel stretch. Hold 30-60____ seconds while counting out loud. Repeat with other leg. Repeat _2-3___ times. Do __2-3__ sessions per day.  Pull right knee toward opposite shoulder. Hold ____ seconds. Relax. Repeat ____ times per set. Do ____ sets per session. Do ____ sessions per day.  Trigger Point Dry Needling  . What is Trigger Point Dry Needling (DN)? o DN is a  physical therapy technique used to treat muscle pain and dysfunction. Specifically, DN helps deactivate muscle trigger points (muscle knots).  o A thin filiform needle is used to penetrate the skin and stimulate the underlying trigger point. The goal is for a local twitch response (LTR) to occur and for the trigger point to relax. No medication of any kind is injected during the procedure.   . What Does Trigger Point Dry Needling Feel Like?  o The procedure feels different for each individual patient. Some patients report that they do not actually feel the needle enter the skin and overall the process is not painful. Very mild bleeding may occur. However, many patients feel a deep cramping in the muscle in which the needle was inserted. This is the local twitch response.   Marland Kitchen How Will I feel after the treatment? o Soreness is normal, and the onset of soreness may not occur for a few hours. Typically this soreness does not last longer than two days.  o Bruising is uncommon, however; ice can be used to decrease any possible bruising.  o In rare cases feeling tired or nauseous after the treatment is normal. In addition, your symptoms may get worse before they get better, this period will typically not last longer than 24 hours.   . What Can I do After My Treatment? o Increase your hydration by drinking more  water for the next 24 hours. o You may place ice or heat on the areas treated that have become sore, however, do not use heat on inflamed or bruised areas. Heat often brings more relief post needling. o You can continue your regular activities, but vigorous activity is not recommended initially after the treatment for 24 hours. o DN is best combined with other physical therapy such as strengthening, stretching, and other therapies.   Voncille Lo, PT 06/25/2016 8:22 AM Phone: [phone] Fax: [phone]

## 2016-06-25 NOTE — Therapy (Signed)
[place], [place], [address] Phone: [phone]   Fax:  [phone]  Physical Therapy Evaluation  Patient Details  Name: [name] MRN: [id number] Date of Birth: 07/01/1976 Referring Provider: Suella Broad MD  Encounter Date: 06/25/2016      PT End of Session - 06/25/16 0825    Visit Number 1   Number of Visits 12   Date for PT Re-Evaluation 08/06/16   Authorization Type [employer]/ [employer]    Authorization Time Period 08/06/16   PT Start Time 0758   PT Stop Time 0850   PT Time Calculation (min) 52 min   Activity Tolerance Patient tolerated treatment well   Behavior During Therapy [facility] for tasks assessed/performed      Past Medical History  Diagnosis Date  . Hypertension     Past Surgical History  Procedure Laterality Date  . Cesarean section      There were no vitals filed for this visit.       Subjective Assessment - 06/25/16 0804    Subjective I didnt have anything to trigger pain.  I have lost 26 pounds by exercising and go to [facility].  Back pain started in March.   Dr Osborne Casco and did a steroid taper   Pertinent History HTN, seasonal allergies    Limitations Walking  Pain mostly in the morning and after I start moving I am moving better.   How long can you sit comfortably? 1 hour   How long can you stand comfortably? 30 min   How long can you walk comfortably? 30 min   Diagnostic tests x ray   Patient Stated Goals alleviation of back pain more on right and left   Currently in Pain? Yes   Pain Score 6    Pain Location Back   Pain Orientation Right;Left   Pain Descriptors / Indicators Sharp;Aching   Pain Type Chronic pain   Pain Onset More than a month ago   Pain Frequency Intermittent   Aggravating Factors  squats. getting out of bed in the morning. lunges   Pain Relieving Factors heat, ice, biofreeze, ibuprofen   Multiple Pain Sites Yes            [facility] PT  Assessment - 06/25/16 0801    Assessment   Medical Diagnosis DDD lumbar spine   Referring Provider Suella Broad MD   Onset Date/Surgical Date 03/06/16   Hand Dominance Right   Prior Therapy None   Precautions   Precautions None   Restrictions   Weight Bearing Restrictions No   Balance Screen   Has the patient fallen in the past 6 months No   Has the patient had a decrease in activity level because of a fear of falling?  No   Is the patient reluctant to leave their home because of a fear of falling?  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 , case manager    Observation/Other Assessments   Focus on Therapeutic Outcomes (FOTO)  FOTO intake 69%, limtation 31% predicted 23%   Functional Tests   Functional tests Lunges;Squat   Squat  Comments pt with wt shift to left , non symmetrical   Lunges   Comments unable to lunge on right side   Posture/Postural Control   Posture/Postural Control Postural limitations   Postural Limitations Forward head;Rounded Shoulders;Anterior pelvic tilt   ROM / Strength   AROM / PROM / Strength AROM;Strength   AROM   Right Hip External Rotation  15   Right Hip Internal Rotation  40   Left Hip External Rotation  27   Left Hip Internal Rotation  38   Lumbar Flexion 80   Lumbar Extension 27   Lumbar - Right Side Bend 27   Lumbar - Left Side Bend 19   Lumbar - Right Rotation 75%   Lumbar - Left Rotation 75%   Strength   Overall Strength Deficits   Overall Strength Comments Pt with deficits in core, abdominal 3/5    Flexibility   Hamstrings WNL 90 degrees bil   Palpation   Palpation comment marked tenderness over   proximal piriformis         Therex - piriformis stretch in supine and sitting 30 sec x 2 each on right            Trigger Point Dry Needling - 06/25/16 0900    Consent Given? Yes   Education Handout Provided Yes   Muscles Treated Upper Body Piriformis   Muscles Treated Lower Body Piriformis   Piriformis Response Twitch response elicited;Palpable increased muscle length  right                   PT Long Term Goals - 06/25/16 0847    PT LONG TERM GOAL #1   Title "Demonstrate and verbalize techniques to reduce the risk of re-injury including: lifting, posture, body mechanics.    Time 6   Period Weeks   Status New   PT LONG TERM GOAL #2   Title "Pt will be independent with advanced HEP   Time 6   Period Weeks   Status New   PT LONG TERM GOAL #3   Title "Pain will decrease to 2/10 or less  with all functional activities   Time 6   Period Weeks   Status New   PT LONG TERM GOAL #4   Title "FOTO will improve from  31%  to  23%   indicating improved functional mobility.    Time 6   Period Weeks   Status New   PT LONG TERM GOAL #5   Title Pt will be able to return to community exericise class without exacerbating pain   Time 6   Period Weeks   Status New               Plan - 06/25/16 1419    Clinical Impression Statement 40 yo pt presents with bilateral back pain but radiating mostly into right buttock with tenderness of palpation over right piriformins with trigger point pain. Pt presents with low complexity  eval and deficits in AROM, muscle weakness and pain greater in morning and after prolonged standing and walking. as well as abdominal weakness.Pt would benefit from skilled PT to address deficits and maximize function to return to community wellness activities and work    Rehab Potential Good   PT Frequency 2x / week   PT Duration 6 weeks   PT Treatment/Interventions ADLs/Self Care Home Management;Cryotherapy;Electrical  Stimulation;Ultrasound;Moist Heat;Iontophoresis 4mg /ml Dexamethasone;Gait training;Stair training;Therapeutic exercise;Patient/family education;Neuromuscular re-education;Dry needling;Manual techniques   PT Next Visit Plan Begin abdominal strenght /  pre pilates, assess DN benefit   PT Home Exercise Plan piriformis stretch and posture    Consulted and Agree with Plan of Care Patient      Patient will benefit from skilled therapeutic intervention in order to improve the following deficits and impairments:  Decreased activity tolerance, Decreased range of motion, Decreased strength, Postural dysfunction, Pain, Increased fascial restricitons, Increased muscle spasms  Visit Diagnosis: Bilateral low back pain with right-sided sciatica  Muscle weakness (generalized)  Stiffness of right hip, not elsewhere classified     Problem List There are no active problems to display for this patient.  Voncille Lo, PT 06/25/2016 2:35 PM Phone: [phone] Fax: [phone]  By signing I understand that I am ordering/authorizing the use of Iontophoresis using 4 mg/mL of dexamethasone as a component of this plan of care.  [place], [place], [address] Phone: [phone]   Fax:  [phone]  Name: [name] MRN: [id number] Date of Birth: 08-Dec-1976

## 2016-07-04 ENCOUNTER — Ambulatory Visit: Payer: 59 | Attending: Physical Medicine and Rehabilitation | Admitting: Physical Therapy

## 2016-07-04 DIAGNOSIS — M5441 Lumbago with sciatica, right side: Secondary | ICD-10-CM | POA: Diagnosis not present

## 2016-07-04 DIAGNOSIS — M25651 Stiffness of right hip, not elsewhere classified: Secondary | ICD-10-CM | POA: Diagnosis not present

## 2016-07-04 DIAGNOSIS — M6281 Muscle weakness (generalized): Secondary | ICD-10-CM

## 2016-07-04 NOTE — Patient Instructions (Signed)
   PELVIC TILT  Lie on back, legs bent. Exhale, tilting top of pelvis back, pubic bone up, to flatten lower back. Inhale, rolling pelvis opposite way, top forward, pubic bone down, arch in back. Repeat __10__ times. Do __2__ sessions per day. Copyright  VHI. All rights reserved.    Isometric Hold With Pelvic Floor (Hook-Lying)  Lie with hips and knees bent. Slowly inhale, and then exhale. Pull navel toward spine and tighten pelvic floor. Hold for __10_ seconds. Continue to breathe in and out during hold. Rest for _10__ seconds. Repeat __10_ times. Do __2-3_ times a day.   Knee Fold  Lie on back, legs bent, arms by sides. Exhale, lifting knee to chest. Inhale, returning. Keep abdominals flat, navel to spine. Repeat __10__ times, alternating legs. Do __2__ sessions per day.  Knee Drop  Keep pelvis stable. Without rotating hips, slowly drop knee to side, pause, return to center, bring knee across midline toward opposite hip. Feel obliques engaging. Repeat for ___10_ times each leg.   Copyright  VHI. All rights reserved.       Heel Slide to Straight   Slide one leg down to straight. Return. Be sure pelvis does not rock forward, tilt, rotate, or tip to side. Do _10__ times. Restabilize pelvis. Repeat with other leg. Do __1-2_ sets, __2_ times per day.  [email or web address]   Copyright  VHI. All rights reserved.  Voncille Lo, PT 07/04/2016 8:10 AM Phone: [phone] Fax: [phone]

## 2016-07-04 NOTE — Therapy (Signed)
[place], [place], [address] Phone: [phone]   Fax:  [phone]  Physical Therapy Treatment  Patient Details  Name: [name] MRN: [id number] Date of Birth: Aug 30, 1976 Referring Provider: Suella Broad MD  Encounter Date: 07/04/2016      PT End of Session - 07/04/16 0811    Visit Number 2   Number of Visits 12   Date for PT Re-Evaluation 08/06/16   Authorization Type UMR/ BCBS    Authorization Time Period 08/06/16   PT Start Time 0802   PT Stop Time 0855   PT Time Calculation (min) 53 min   Activity Tolerance Patient tolerated treatment well   Behavior During Therapy [facility] for tasks assessed/performed      Past Medical History  Diagnosis Date  . Hypertension     Past Surgical History  Procedure Laterality Date  . Cesarean section      There were no vitals filed for this visit.      Subjective Assessment - 07/04/16 0807    Subjective Last week I didnt have any problems after needling.  today I am a 5/10 starting off.  but I like the needling. Tuesday it felt like it was radiating a little more to my left side. My husband had a THR on the left and I am the cargiver   Pertinent History HTN, seasonal allergies    Limitations Walking   Patient Stated Goals alleviation of back pain more on right and left   Currently in Pain? Yes   Pain Score 5    Pain Location Back   Pain Orientation Right;Left   Pain Descriptors / Indicators Aching;Sharp   Pain Type Chronic pain   Pain Onset More than a month ago   Pain Frequency Intermittent   Aggravating Factors  squats , getting out of bed inthe morning, lunges            [facility] PT Assessment - 07/04/16 0829    Posture/Postural Control   Posture/Postural Control No significant limitations   Posture Comments Right QL elevated                      [facility] Adult PT Treatment/Exercise - 07/04/16 0829    Lumbar Exercises:  Supine   Ab Set 10 reps  VC /TC for reinforcement   AB Set Limitations excellent control   Clam 10 reps  x 2   Clam Limitations VC for technique   Heel Slides 10 reps  x 2 each right and left    Heel Slides Limitations VC for technique   Bent Knee Raise 10 reps   Bent Knee Raise Limitations VC for technique   Other Supine Lumbar Exercises pelvic tilt 2 x 10 3-5 sec hold   Knee/Hip Exercises: Stretches   Piriformis Stretch Both;30 seconds   Piriformis Stretch Limitations x 3   Modalities   Modalities Moist Heat   Moist Heat Therapy   Number Minutes Moist Heat 15 Minutes   Moist Heat Location Hip  right hip and Quadratus in left sidelying   Manual Therapy   Manual Therapy Soft tissue mobilization;Myofascial release   Soft tissue mobilization Right quadratus and piriformis   Myofascial Release Quadratus Lumborum and Piriformis     Given tennis ball for self myofascial release of right gluteals     Trigger Point Dry Needling - 07/04/16 0827    Consent Given? Yes   Education Handout Provided No  previously  given   Muscles Treated Upper Body Quadratus Lumborum;Piriformis;Gluteus minimus;Gluteus maximus  only right side today   Muscles Treated Lower Body Piriformis;Gluteus maximus;Gluteus minimus  quadratus lumborum twitch on Right   Gluteus Maximus Response Twitch response elicited;Palpable increased muscle length   Gluteus Minimus Response Twitch response elicited;Palpable increased muscle length   Piriformis Response Twitch response elicited;Palpable increased muscle length              PT Education - 07/04/16 0810    Education provided Yes   Education Details Pre pilates added to HEP and reinforcement of aftercare of Dry needling   Person(s) Educated Patient   Methods Explanation;Demonstration;Tactile cues;Verbal cues;Handout   Comprehension Verbalized understanding;Returned demonstration             PT Long Term Goals - 07/04/16 0846    PT LONG TERM  GOAL #1   Title "Demonstrate and verbalize techniques to reduce the risk of re-injury including: lifting, posture, body mechanics.    Time 6   Period Weeks   Status On-going   PT LONG TERM GOAL #2   Title "Pt will be independent with advanced HEP   Time 6   Period Weeks   Status On-going   PT LONG TERM GOAL #3   Title "Pain will decrease to 2/10 or less  with all functional activities   Time 6   Period Weeks   Status On-going   PT LONG TERM GOAL #4   Title "FOTO will improve from  31%  to  23%   indicating improved functional mobility.    Time 6   Period Weeks   Status On-going   PT LONG TERM GOAL #5   Title Pt will be able to return to community exericise class without exacerbating pain   Time 6   Period Weeks   Status On-going               Plan - 07/04/16 0818    Clinical Impression Statement Pt with minimal pain after trigger point dry needling and working.  Pt now at 5/10 pain and consents to additional dry needling for treatment today.  Pt educated on pre pilates exercisdes today and added to HEP.  Pt  husband has Left THR post 1 week and [name] is the caregiver. Pt with multiple localized twitch responses in Rgith Quadratus Lumborum and Right gluteals/piriformis   Rehab Potential Good   PT Frequency 2x / week   PT Duration 6 weeks   PT Treatment/Interventions ADLs/Self Care Home Management;Cryotherapy;Electrical Stimulation;Ultrasound;Moist Heat;Iontophoresis 4mg /ml Dexamethasone;Gait training;Stair training;Therapeutic exercise;Patient/family education;Neuromuscular re-education;Dry needling;Manual techniques   PT Next Visit Plan Core abdominals , bridging , single limb bridge and assess dry needling Check LTG's  also posture/ body mechanics   PT Home Exercise Plan pre pilates and piriformis exericise   Consulted and Agree with Plan of Care Patient      Patient will benefit from skilled therapeutic intervention in order to improve the following deficits and  impairments:  Decreased activity tolerance, Decreased range of motion, Decreased strength, Postural dysfunction, Pain, Increased fascial restricitons, Increased muscle spasms  Visit Diagnosis: Bilateral low back pain with right-sided sciatica  Muscle weakness (generalized)  Stiffness of right hip, not elsewhere classified     Problem List There are no active problems to display for this patient.   Voncille Lo, PT 07/04/2016 8:48 AM Phone: [phone] Fax: [facility] [place], [place], [address] Phone: [phone]   Fax:  [phone]  Name: [name] MRN: [id number] Date of Birth: [birth year]

## 2016-07-11 ENCOUNTER — Ambulatory Visit: Payer: 59 | Admitting: Physical Therapy

## 2016-07-11 DIAGNOSIS — M5441 Lumbago with sciatica, right side: Secondary | ICD-10-CM | POA: Diagnosis not present

## 2016-07-11 DIAGNOSIS — M25651 Stiffness of right hip, not elsewhere classified: Secondary | ICD-10-CM | POA: Diagnosis not present

## 2016-07-11 DIAGNOSIS — M6281 Muscle weakness (generalized): Secondary | ICD-10-CM

## 2016-07-11 NOTE — Therapy (Signed)
[place], [place], [address] Phone: [phone]   Fax:  [phone]  Physical Therapy Treatment  Patient Details  Name: [name] MRN: [id number] Date of Birth: 1976/09/04 Referring Provider: Suella Broad MD  Encounter Date: 07/11/2016      PT End of Session - 07/11/16 0801    Visit Number 3   Number of Visits 12   Date for PT Re-Evaluation 08/06/16   Authorization Type UMR/ BCBS    Authorization Time Period 08/06/16   PT Start Time 0800   PT Stop Time 0856   PT Time Calculation (min) 56 min   Activity Tolerance Patient tolerated treatment well   Behavior During Therapy [facility] for tasks assessed/performed      Past Medical History  Diagnosis Date  . Hypertension     Past Surgical History  Procedure Laterality Date  . Cesarean section      There were no vitals filed for this visit.      Subjective Assessment - 07/11/16 0805    Subjective This past Sunday I started having throbbing pain going down  to right calf muscle. I had to use  a heating pad this morning. I would have  been a 10/10.  I have returned at work and  I walk mostly and walking up and down stairs   Pertinent History HTN, seasonal allergies    Limitations Walking;Standing;Sitting   How long can you sit comfortably? unlimited   How long can you stand comfortably? 30 min   How long can you walk comfortably? 30 min   Diagnostic tests x ray   Patient Stated Goals alleviation of back pain more on right and left   Currently in Pain? Yes   Pain Score 8    Pain Location Back   Pain Orientation Right;Left  right greater than Left   Pain Descriptors / Indicators Aching;Sharp;Throbbing   Pain Type Chronic pain   Pain Onset More than a month ago   Pain Frequency Intermittent   Aggravating Factors  working, standing and walking   Pain Relieving Factors heat, ice , biofreeze ibuprofen and tennis ball                          [facility] Adult PT Treatment/Exercise - 07/11/16 0815    Self-Care   Self-Care ADL's;Lifting;Posture   ADL's How to make beds and    Lifting proper lifting and in job with pt lifting   Posture proper sitting ,standing and walking   Lumbar Exercises: Standing   Other Standing Lumbar Exercises Standing with right trunk rotation and extension with hand down back of right thigh 5 sec x 10   Lumbar Exercises: Supine   Ab Set 10 reps  VC /TC for reinforcement   AB Set Limitations excellent control   Clam 10 reps  x 2   Clam Limitations VC for technique   Heel Slides 10 reps  x 2 each right and left    Heel Slides Limitations VC for technique   Bent Knee Raise 10 reps   Bent Knee Raise Limitations VC for technique   Bridge 10 reps  no pain squeezing ball b/t knees   Other Supine Lumbar Exercises pelvic tilt 2 x 10 3-5 sec hold   Lumbar Exercises: Prone   Other Prone Lumbar Exercises Prone press ups 5 x with 5-10 second stretch. Pain centralizes into buttock   Lumbar Exercises: Quadruped  [place] 5 reps   Single Arm Raise 5 reps;Left;Right   Single Arm Raise Weights (lbs) 0   Single Arm Raises Limitations VC for correct execution   Straight Leg Raise 5 reps;3 seconds  pain in right side   Knee/Hip Exercises: Stretches   Piriformis Stretch Both;30 seconds  knee over knee and to chest   Piriformis Stretch Limitations x 3   Modalities   Modalities --   Moist Heat Therapy   Number Minutes Moist Heat --   Moist Heat Location --   Traction   Type of Traction Lumbar   Min (lbs) 60   Max (lbs) 100   Hold Time 1 min   Rest Time --  6 steps   Time 17                PT Education - 07/11/16 0812    Education provided Yes   Education Details Added McKenzie extesion and Printmaker ADL' handout   Person(s) Educated Patient   Methods Explanation;Demonstration;Tactile cues;Verbal cues;Handout   Comprehension Verbalized  understanding;Returned demonstration             PT Long Term Goals - 07/11/16 0813    PT LONG TERM GOAL #1   Title "Demonstrate and verbalize techniques to reduce the risk of re-injury including: lifting, posture, body mechanics.    Baseline posture/ body mechanics   Time 6   Period Weeks   Status On-going   PT LONG TERM GOAL #2   Title "Pt will be independent with advanced HEP   Baseline addded mckenzie   Time 6   Period Weeks   Status On-going   PT LONG TERM GOAL #3   Title "Pain will decrease to 2/10 or less  with all functional activities   Baseline today 8/10 but centralizes with mckenzie exercise   Time 6   Period Weeks   Status On-going   PT LONG TERM GOAL #4   Title "FOTO will improve from  31%  to  23%   indicating improved functional mobility.    Time 6   Period Weeks   Status On-going   PT LONG TERM GOAL #5   Title Pt will be able to return to community exericise class without exacerbating pain   Time 6   Period Weeks   Status On-going               Plan - 07/11/16 0834    Clinical Impression Statement Pt enters clinc after returning to work and is at 8/10 pain with Right radiating pain into right calf.  Pt responds with centralization of symptoms with Mckenzie extension exericies.  Pt  declinced trigger point dry needling and then consented to lumbar traction.  Pt educated on lifting and posture , body mechanics and ADL;s and given handouts.   Will continue to monitor for symptoms.     Rehab Potential Good   PT Frequency 2x / week   PT Duration 6 weeks   PT Treatment/Interventions ADLs/Self Care Home Management;Cryotherapy;Electrical Stimulation;Ultrasound;Moist Heat;Iontophoresis 4mg /ml Dexamethasone;Gait training;Stair training;Therapeutic exercise;Patient/family education;Neuromuscular re-education;Dry needling;Manual techniques   PT Next Visit Plan Core abdominals , single limb bridge and table top and 100's. Posture/body mechanics goals   PT  Home Exercise Plan added mckenzie exericises and handout for posture/body mechanics and ADL's   Consulted and Agree with Plan of Care Patient      Patient will benefit from skilled therapeutic intervention in order to improve the following deficits and impairments:  Decreased activity tolerance, Decreased range of motion, Decreased strength, Postural dysfunction, Pain, Increased fascial restricitons, Increased muscle spasms  Visit Diagnosis: Bilateral low back pain with right-sided sciatica  Muscle weakness (generalized)  Stiffness of right hip, not elsewhere classified     Problem List There are no active problems to display for this patient.  [name], PT 07/11/2016 12:27 PM Phone: [phone] Fax: [facility] [address] [place], [place], [address] Phone: [phone]   Fax:  [phone]  Name: TAMETHA NOWAK MRN: [id number] Date of Birth: Jul 12, 1976

## 2016-07-11 NOTE — Patient Instructions (Signed)
Sleeping on Back  Place pillow under knees. A pillow with cervical support and a roll around waist are also helpful. Copyright  VHI. All rights reserved.  Sleeping on Side Place pillow between knees. Use cervical support under neck and a roll around waist as needed. Copyright  VHI. All rights reserved.   Sleeping on Stomach   If this is the only desirable sleeping position, place pillow under lower legs, and under stomach or chest as needed.  Posture - Sitting   Sit upright, head facing forward. Try using a roll to support lower back. Keep shoulders relaxed, and avoid rounded back. Keep hips level with knees. Avoid crossing legs for long periods. Stand to Sit / Sit to Stand   To sit: Bend knees to lower self onto front edge of chair, then scoot back on seat. To stand: Reverse sequence by placing one foot forward, and scoot to front of seat. Use rocking motion to stand up.   Work Height and Reach  Ideal work height is no more than 2 to 4 inches below elbow level when standing, and at elbow level when sitting. Reaching should be limited to arm's length, with elbows slightly bent.  Bending  Bend at hips and knees, not back. Keep feet shoulder-width apart.    Posture - Standing   Good posture is important. Avoid slouching and forward head thrust. Maintain curve in low back and align ears over shoul- ders, hips over ankles.  Alternating Positions   Alternate tasks and change positions frequently to reduce fatigue and muscle tension. Take rest breaks. Computer Work   Position work to face forward. Use proper work and seat height. Keep shoulders back and down, wrists straight, and elbows at right angles. Use chair that provides full back support. Add footrest and lumbar roll as needed.  Getting Into / Out of Car  Lower self onto seat, scoot back, then bring in one leg at a time. Reverse sequence to get out.  Dressing  Lie on back to pull socks or slacks over feet, or sit  and bend leg while keeping back straight.    Housework - Sink  Place one foot on ledge of cabinet under sink when standing at sink for prolonged periods.   Pushing / Pulling  Pushing is preferable to pulling. Keep back in proper alignment, and use leg muscles to do the work.  Deep Squat   Squat and lift with both arms held against upper trunk. Tighten stomach muscles without holding breath. Use smooth movements to avoid jerking.  Avoid Twisting   Avoid twisting or bending back. Pivot around using foot movements, and bend at knees if needed when reaching for articles.  Carrying Luggage   Distribute weight evenly on both sides. Use a cart whenever possible. Do not twist trunk. Move body as a unit.   Lifting Principles .Maintain proper posture and head alignment. .Slide object as close as possible before lifting. .Move obstacles out of the way. .Test before lifting; ask for help if too heavy. .Tighten stomach muscles without holding breath. .Use smooth movements; do not jerk. .Use legs to do the work, and pivot with feet. .Distribute the work load symmetrically and close to the center of trunk. .Push instead of pull whenever possible.   Ask For Help   Ask for help and delegate to others when possible. Coordinate your movements when lifting together, and maintain the low back curve.  Log Roll   Lying on back, bend left knee and place left   arm across chest. Roll all in one movement to the right. Reverse to roll to the left. Always move as one unit. Housework - Sweeping  Use long-handled equipment to avoid stooping.   Housework - Wiping  Position yourself as close as possible to reach work surface. Avoid straining your back.  Laundry - Unloading Wash   To unload small items at bottom of washer, lift leg opposite to arm being used to reach.  [place] close to area to be raked. Use arm movements to do the work. Keep back straight and avoid  twisting.     Cart  When reaching into cart with one arm, lift opposite leg to keep back straight.   Getting Into / Out of Bed  Lower self to lie down on one side by raising legs and lowering head at the same time. Use arms to assist moving without twisting. Bend both knees to roll onto back if desired. To sit up, start from lying on side, and use same move-ments in reverse. Housework - Vacuuming  Hold the vacuum with arm held at side. Step back and forth to move it, keeping head up. Avoid twisting.   Laundry - IT consultant so that bending and twisting can be avoided.   Laundry - Unloading Dryer  Squat down to reach into clothes dryer or use a reacher.  Gardening - Weeding / Probation officer or Kneel. Knee pads may be helpful.                      Elbow Prop (Extension)    when initially hurting,Prop body up on elbows for ____ seconds. Slowly lower it. Repeat ____ times. Do ____ sessions per day.  Extension   Lie face down, hands close to chest. Press trunk up, arching back. Keep neck long, shoulders down. Tighten buttocks to protect lower back. Hold __5-10__ seconds. Repeat __5__ times. Do _2-3___ sessions per day.  Backward Bend (Standing)   Arch backward to make hollow of back deeper. Hold _5-10___ seconds. Repeat _5___ times per set. Do __1__ sets per session. Try to do every hour next to a counter for stability in extension  Copyright  VHI. All rights reserved.   Voncille Lo, PT 07/11/2016 8:12 AM Phone: [phone] Fax: [phone]

## 2016-07-15 ENCOUNTER — Ambulatory Visit: Payer: 59 | Admitting: Physical Therapy

## 2016-07-15 DIAGNOSIS — M25651 Stiffness of right hip, not elsewhere classified: Secondary | ICD-10-CM | POA: Diagnosis not present

## 2016-07-15 DIAGNOSIS — M6281 Muscle weakness (generalized): Secondary | ICD-10-CM

## 2016-07-15 DIAGNOSIS — M5441 Lumbago with sciatica, right side: Secondary | ICD-10-CM | POA: Diagnosis not present

## 2016-07-15 NOTE — Therapy (Signed)
[place], [place], [address] Phone: [phone]   Fax:  [phone]  Physical Therapy Treatment  Patient Details  Name: [name] MRN: [id number] Date of Birth: 1976-09-15 Referring Provider: Suella Broad MD  Encounter Date: 07/15/2016      PT End of Session - 07/15/16 1052    Visit Number 4   Number of Visits 12   Date for PT Re-Evaluation 08/06/16   PT Start Time 0803   PT Stop Time [id number]   PT Time Calculation (min) 51 min   Activity Tolerance Patient tolerated treatment well   Behavior During Therapy [facility] for tasks assessed/performed      Past Medical History  Diagnosis Date  . Hypertension     Past Surgical History  Procedure Laterality Date  . Cesarean section      There were no vitals filed for this visit.      Subjective Assessment - 07/15/16 0806    Pain Score 6    Pain Location Back   Pain Orientation Right;Left   Pain Descriptors / Indicators Aching   Pain Frequency Intermittent   Aggravating Factors  turning at night,  sitting , walking longer   Pain Relieving Factors stretches, biofreeze                         [facility] Adult PT Treatment/Exercise - 07/15/16 0001    Lumbar Exercises: Standing   Other Standing Lumbar Exercises Mckenzie lateral techniques   Lumbar Exercises: Supine   Bent Knee Raise 10 reps   Bridge Limitations 10 X both   Lumbar Exercises: Prone   Single Arm Raise 10 reps;1 second   Straight Leg Raises Limitations 10   Opposite Arm/Leg Raise 10 reps;1 second  centralized to low back   Opposite Arm/Leg Raise Limitations 1 pillow added for low back comfort.   Other Prone Lumbar Exercises prone press up  5 X 5 seconds   Knee/Hip Exercises: Stretches   Piriformis Stretch Both;30 seconds  knee over knee and to chest   Piriformis Stretch Limitations 3 X   Other Knee/Hip Stretches quadratus Lumborum stretches ,  3 positions  2-3 reps 15 seconds each   Moist Heat Therapy   Number Minutes Moist Heat 15 Minutes   Moist Heat Location Lumbar Spine  over 1 pillow                PT Education - 07/15/16 1051    Education provided Yes   Education Details Quadratus lumborum,  How to centralize pain   Person(s) Educated Patient   Methods Explanation;Demonstration;Tactile cues;Verbal cues;Handout   Comprehension Verbalized understanding;Returned demonstration             PT Long Term Goals - 07/15/16 1055    PT LONG TERM GOAL #1   Title "Demonstrate and verbalize techniques to reduce the risk of re-injury including: lifting, posture, body mechanics.    Baseline using more squats with lifting at home.   Time 6   Period Weeks   Status On-going   PT LONG TERM GOAL #2   Title "Pt will be independent with advanced HEP   Baseline continue to add exercises   Time 6   Period Weeks   Status On-going   PT LONG TERM GOAL #3   Title "Pain will decrease to 2/10 or less  with all functional activities   Baseline varies.   Time 6   Period Weeks  Status On-going   PT LONG TERM GOAL #4   Title "FOTO will improve from  31%  to  23%   indicating improved functional mobility.    Time 6   Period Weeks   Status Unable to assess   PT LONG TERM GOAL #5   Title Pt will be able to return to community exericise class without exacerbating pain   Time 6   Period Weeks   Status Unable to assess               Plan - 07/15/16 1053    Clinical Impression Statement 4/10 pain at end with pain centralized to back.  She had intermittant leg pain with bridges and was able to centralize.  QL tender Left.  Able to add stretches for Home.     PT Next Visit Plan Check for neutral spine,  Quadratus stretches if needed,  core and stretching with Mckenzie focus   PT Home Exercise Plan QL stretches,  continue,  Stretch calf?   Consulted and Agree with Plan of Care Patient      Patient will benefit from skilled  therapeutic intervention in order to improve the following deficits and impairments:  Decreased activity tolerance, Decreased range of motion, Decreased strength, Postural dysfunction, Pain, Increased fascial restricitons, Increased muscle spasms  Visit Diagnosis: Bilateral low back pain with right-sided sciatica  Muscle weakness (generalized)  Stiffness of right hip, not elsewhere classified     Problem List There are no active problems to display for this patient.   [facility] 07/15/2016, 10:58 AM  [facility] [address] [place], [place], [address] Phone: [phone]   Fax:  [phone]  Name: [name] MRN: [id number] Date of Birth: August 28, 1976    Melvenia Needles, PTA 07/15/2016 10:58 AM Phone: [phone] Fax: [phone]

## 2016-07-15 NOTE — Patient Instructions (Signed)
From EX drawer.  Quadratus stretches standing , sidelying 3-5 X 5 to 30 seconds to be done PRN ,  Scoot for sit to stand.  If leg pain starts or gets worse,  Stop and stretch it away.

## 2016-07-23 ENCOUNTER — Ambulatory Visit: Payer: 59 | Admitting: Physical Therapy

## 2016-07-23 DIAGNOSIS — M5441 Lumbago with sciatica, right side: Principal | ICD-10-CM

## 2016-07-23 DIAGNOSIS — M25651 Stiffness of right hip, not elsewhere classified: Secondary | ICD-10-CM | POA: Diagnosis not present

## 2016-07-23 DIAGNOSIS — M6281 Muscle weakness (generalized): Secondary | ICD-10-CM

## 2016-07-23 DIAGNOSIS — G8929 Other chronic pain: Secondary | ICD-10-CM

## 2016-07-23 NOTE — Patient Instructions (Signed)
From exercise drawer:  McKenzie extension .  All issued. 1 to 15 X each holding15 to 30 + seconds.

## 2016-07-23 NOTE — Therapy (Addendum)
[place], [place], [address] Phone: [phone]   Fax:  [phone]  Physical Therapy Treatment/Discharge NOte  Patient Details  Name: [name] MRN: [id number] Date of Birth: 1976-04-04 Referring Provider: Suella Broad MD  Encounter Date: 07/23/2016      PT End of Session - 07/23/16 0848    Visit Number 4   Number of Visits 12   Date for PT Re-Evaluation 08/06/16   PT Start Time 0802   PT Stop Time 0900   PT Time Calculation (min) 58 min   Behavior During Therapy [facility] for tasks assessed/performed      Past Medical History:  Diagnosis Date  . Hypertension     Past Surgical History:  Procedure Laterality Date  . CESAREAN SECTION      There were no vitals filed for this visit.      Subjective Assessment - 07/23/16 0805    Subjective Numbness has lasted since the weekend.  I got out of bed good this am,  yesterday am it was 7/10 when I got out og bed.  Numbness now constant all the way to foot.  Right leg leg is weaker than left   Currently in Pain? Yes   Pain Score 3   up to 7/10   Pain Location Back   Pain Orientation Right;Left;Lower   Pain Descriptors / Indicators Aching;Tingling;Numbness   Pain Frequency Constant   Aggravating Factors  Standing at  work,  sometimes getting in /out bed, going up stairs.    Pain Relieving Factors taping feet.  nothing helps numbness                         [facility] Adult PT Treatment/Exercise - 07/23/16 0001      Lumbar Exercises: Prone   Single Arm Raise 10 reps   Straight Leg Raise 10 reps   Opposite Arm/Leg Raise Limitations 10   Other Prone Lumbar Exercises prone, prone on elbows,   prone press ups 15 X   a little less numbness noted in leg,      Moist Heat Therapy   Number Minutes Moist Heat 15 Minutes   Moist Heat Location Lumbar Spine     Electrical Stimulation   Electrical Stimulation Location Right gluteal    Electrical Stimulation Action IFC   Electrical Stimulation Parameters To tolerance   Electrical Stimulation Goals Pain     Manual Therapy   Manual Therapy Soft tissue mobilization   Manual therapy comments Tissue softened,  leg pain flared initially then eased                PT Education - 07/23/16 0848    Education provided Yes   Education Details Mckenzie extension   Person(s) Educated Patient   Methods Explanation;Demonstration;Verbal cues;Handout   Comprehension Verbalized understanding;Returned demonstration             PT Long Term Goals - 07/23/16 1845      PT LONG TERM GOAL #1   Title "Demonstrate and verbalize techniques to reduce the risk of re-injury including: lifting, posture, body mechanics.    Time 6   Period Weeks   Status Unable to assess     PT LONG TERM GOAL #2   Title "Pt will be independent with advanced HEP   Baseline continue to add exercises   Time 6   Period Weeks   Status On-going     PT LONG TERM  GOAL #3   Title "Pain will decrease to 2/10 or less  with all functional activities   Baseline varies.   Time 6   Period Weeks   Status On-going     PT LONG TERM GOAL #4   Title "FOTO will improve from  31%  to  23%   indicating improved functional mobility.    Time 6   Period Weeks   Status Unable to assess     PT LONG TERM GOAL #5   Title Pt will be able to return to community exericise class without exacerbating pain   Time 6   Period Weeks   Status Unable to assess               Plan - 07/23/16 0856    Clinical Impression Statement able to centralize and decrease numbness with prone extension exercises,  McKenzie.  Manual and modalities helpful.  She has asked her MD for an MRI.    PT Next Visit Plan review extension,  manual if helpful.     PT Home Exercise Plan McKenzie extension   Consulted and Agree with Plan of Care Patient      Patient will benefit from skilled therapeutic intervention in order to  improve the following deficits and impairments:  Decreased activity tolerance, Decreased range of motion, Decreased strength, Postural dysfunction, Pain, Increased fascial restricitons, Increased muscle spasms  Visit Diagnosis: Bilateral low back pain with right-sided sciatica  Muscle weakness (generalized)  Stiffness of right hip, not elsewhere classified     Problem List There are no active problems to display for this patient.   [facility] 07/23/2016, 6:47 PM  [facility] [address] [place], [place], [address] Phone: [phone]   Fax:  [phone]  Name: [name] MRN: [id number] Date of Birth: 1976-11-09   Melvenia Needles, PTA 07/23/16 6:47 PM Phone: [phone] Fax: [phone]  PHYSICAL THERAPY DISCHARGE SUMMARY  Visits from Start of Care: 4  Current functional level related to goals / functional outcomes: unknown   Remaining deficits: unknown   Education / Equipment: intial HEP Plan:                                                    Patient goals were not met. Patient is being discharged due to not returning since the last visit.  ?????      Voncille Lo, PT Exercise Expert for the Aging Adult  12/12/16 5:01 PM Phone: [phone] Fax: [phone]

## 2016-07-29 ENCOUNTER — Telehealth: Payer: Self-pay | Admitting: Physical Therapy

## 2016-07-29 NOTE — Telephone Encounter (Signed)
Pt was contacted and left message. Pt cancelled last two appt. And PT was checking on pt status.  PT asked that pt would call and let department /PT know if she was returning to MD or continuing therapy and to return call to notify of her wishes.    Voncille Lo, PT 07/29/16 5:38 PM Phone: [phone] Fax: [phone]

## 2016-07-30 ENCOUNTER — Ambulatory Visit: Payer: 59 | Admitting: Physical Therapy

## 2016-08-01 DIAGNOSIS — M5136 Other intervertebral disc degeneration, lumbar region: Secondary | ICD-10-CM | POA: Diagnosis not present

## 2016-08-06 ENCOUNTER — Encounter: Payer: 59 | Admitting: Physical Therapy

## 2016-08-13 MED FILL — MONTELUKAST SOD 10 MG TAB: 10 | 90 days supply | Qty: 90 | Fill #1

## 2016-08-13 MED FILL — HYDROCHLOROTHIAZIDE 25 MG T: 25 | 90 days supply | Qty: 90 | Fill #0

## 2016-09-19 DIAGNOSIS — M5136 Other intervertebral disc degeneration, lumbar region: Secondary | ICD-10-CM | POA: Diagnosis not present

## 2016-10-08 MED FILL — MELOXICAM 7.5 MG TABLET: 7.5 | 30 days supply | Qty: 60 | Fill #0

## 2016-10-08 MED FILL — MOMETASONE FUROATE 50 MCG S: 50 | 90 days supply | Qty: 51 | Fill #0

## 2016-12-10 MED FILL — MONTELUKAST SOD 10 MG TAB: 10 | 90 days supply | Qty: 90 | Fill #2

## 2016-12-10 MED FILL — HYDROCHLOROTHIAZIDE 25 MG T: 25 | 90 days supply | Qty: 90 | Fill #1

## 2016-12-10 MED FILL — MELOXICAM 7.5 MG TABLET: 7.5 | 30 days supply | Qty: 60 | Fill #1

## 2016-12-30 HISTORY — PX: CARPAL TUNNEL RELEASE: SHX101

## 2017-01-28 DIAGNOSIS — M5136 Other intervertebral disc degeneration, lumbar region: Secondary | ICD-10-CM | POA: Diagnosis not present

## 2017-02-11 DIAGNOSIS — M5136 Other intervertebral disc degeneration, lumbar region: Secondary | ICD-10-CM | POA: Diagnosis not present

## 2017-03-17 MED FILL — MELOXICAM 7.5 MG TABLET: 7.5 | 30 days supply | Qty: 60 | Fill #2

## 2017-03-17 MED FILL — MONTELUKAST SOD 10 MG TAB: 10 | 90 days supply | Qty: 90 | Fill #3

## 2017-03-17 MED FILL — HYDROCHLOROTHIAZIDE 25 MG T: 25 | 90 days supply | Qty: 90 | Fill #2

## 2017-03-31 DIAGNOSIS — R51 Headache: Secondary | ICD-10-CM | POA: Diagnosis not present

## 2017-04-14 DIAGNOSIS — M542 Cervicalgia: Secondary | ICD-10-CM | POA: Diagnosis not present

## 2017-04-14 DIAGNOSIS — M5136 Other intervertebral disc degeneration, lumbar region: Secondary | ICD-10-CM | POA: Diagnosis not present

## 2017-04-15 DIAGNOSIS — R8299 Other abnormal findings in urine: Secondary | ICD-10-CM | POA: Diagnosis not present

## 2017-04-15 DIAGNOSIS — Z0000 Encounter for general adult medical examination without abnormal findings: Secondary | ICD-10-CM | POA: Diagnosis not present

## 2017-04-21 DIAGNOSIS — Z803 Family history of malignant neoplasm of breast: Secondary | ICD-10-CM | POA: Diagnosis not present

## 2017-04-21 DIAGNOSIS — Z1231 Encounter for screening mammogram for malignant neoplasm of breast: Secondary | ICD-10-CM | POA: Diagnosis not present

## 2017-04-22 DIAGNOSIS — J302 Other seasonal allergic rhinitis: Secondary | ICD-10-CM | POA: Diagnosis not present

## 2017-04-22 DIAGNOSIS — E668 Other obesity: Secondary | ICD-10-CM | POA: Diagnosis not present

## 2017-04-22 DIAGNOSIS — E7800 Pure hypercholesterolemia, unspecified: Secondary | ICD-10-CM | POA: Diagnosis not present

## 2017-04-22 DIAGNOSIS — Z1389 Encounter for screening for other disorder: Secondary | ICD-10-CM | POA: Diagnosis not present

## 2017-04-22 DIAGNOSIS — Z0000 Encounter for general adult medical examination without abnormal findings: Secondary | ICD-10-CM | POA: Diagnosis not present

## 2017-04-22 DIAGNOSIS — F321 Major depressive disorder, single episode, moderate: Secondary | ICD-10-CM | POA: Diagnosis not present

## 2017-04-22 DIAGNOSIS — Z6831 Body mass index (BMI) 31.0-31.9, adult: Secondary | ICD-10-CM | POA: Diagnosis not present

## 2017-04-22 DIAGNOSIS — I10 Essential (primary) hypertension: Secondary | ICD-10-CM | POA: Diagnosis not present

## 2017-05-14 DIAGNOSIS — M79642 Pain in left hand: Secondary | ICD-10-CM | POA: Diagnosis not present

## 2017-05-14 DIAGNOSIS — G5603 Carpal tunnel syndrome, bilateral upper limbs: Secondary | ICD-10-CM | POA: Diagnosis not present

## 2017-05-14 DIAGNOSIS — M79641 Pain in right hand: Secondary | ICD-10-CM | POA: Diagnosis not present

## 2017-05-19 MED FILL — MELOXICAM 7.5 MG TABLET: 7.5 | 30 days supply | Qty: 60 | Fill #0

## 2017-06-12 MED FILL — MONTELUKAST SOD 10 MG TAB: 10 | 90 days supply | Qty: 90 | Fill #0

## 2017-07-08 ENCOUNTER — Ambulatory Visit (INDEPENDENT_AMBULATORY_CARE_PROVIDER_SITE_OTHER): Payer: 59 | Admitting: Obstetrics & Gynecology

## 2017-07-08 ENCOUNTER — Encounter: Payer: Self-pay | Admitting: Obstetrics & Gynecology

## 2017-07-08 VITALS — BP 132/86 | Ht 67.0 in | Wt 207.0 lb

## 2017-07-08 DIAGNOSIS — Z3049 Encounter for surveillance of other contraceptives: Secondary | ICD-10-CM

## 2017-07-08 DIAGNOSIS — Z01419 Encounter for gynecological examination (general) (routine) without abnormal findings: Secondary | ICD-10-CM | POA: Diagnosis not present

## 2017-07-08 DIAGNOSIS — E6609 Other obesity due to excess calories: Secondary | ICD-10-CM | POA: Diagnosis not present

## 2017-07-08 DIAGNOSIS — Z6832 Body mass index (BMI) 32.0-32.9, adult: Secondary | ICD-10-CM

## 2017-07-08 NOTE — Progress Notes (Signed)
[name] 05-04-1976 [id number]   History:    41 y.o.  G4P1A3  Married.  Son is 88 yo, enjoys basketball.  RP:  Established patient presenting for annual gyn exam   HPI:  H/O Infertility, declines contraception.  Menses every month, x 7 days, flow moderate, improved since last year.  Per patient, Hb >12 this year with Dr [name].  No pelvic pain.  Normal vaginal secretions.  Breasts wnl.  Mictions/BMs normal.  Followed by Ortho for Lumbar bulging disc with siatalgia.    Past medical history,surgical history, family history and social history were all reviewed and documented in the EPIC chart.  Gynecologic History Patient's last menstrual period was 06/14/2017. Contraception: none and H/O Infertility Last Pap: 03/2016. Results were: normal Last mammogram: 03/2017. Results were: Normal per patient [name])  Obstetric History OB History  Gravida Para Term Preterm AB Living  4 1     3 1   SAB TAB Ectopic Multiple Live Births  3            # Outcome Date GA Lbr Len/2nd Weight Sex Delivery Anes PTL Lv  4 SAB           3 SAB           2 SAB           1 Para                ROS: A ROS was performed and pertinent positives and negatives are included in the history.  GENERAL: No fevers or chills. HEENT: No change in vision, no earache, sore throat or sinus congestion. NECK: No pain or stiffness. CARDIOVASCULAR: No chest pain or pressure. No palpitations. PULMONARY: No shortness of breath, cough or wheeze. GASTROINTESTINAL: No abdominal pain, nausea, vomiting or diarrhea, melena or bright red blood per rectum. GENITOURINARY: No urinary frequency, urgency, hesitancy or dysuria. MUSCULOSKELETAL: No joint or muscle pain, no back pain, no recent trauma. DERMATOLOGIC: No rash, no itching, no lesions. ENDOCRINE: No polyuria, polydipsia, no heat or cold intolerance. No recent change in weight. HEMATOLOGICAL: No anemia or easy bruising or bleeding. NEUROLOGIC: No headache, seizures,  numbness, tingling or weakness. PSYCHIATRIC: No depression, no loss of interest in normal activity or change in sleep pattern.     Exam:   BP 132/86   Ht 5\' 7"  (1.702 m)   Wt 93.9 kg (207 lb)   LMP 06/14/2017   BMI 32.42 kg/m   Body mass index is 32.42 kg/m.  General appearance : Well developed well nourished female. No acute distress HEENT: Eyes: no retinal hemorrhage or exudates,  Neck supple, trachea midline, no carotid bruits, no thyroidmegaly Lungs: Clear to auscultation, no rhonchi or wheezes, or rib retractions  Heart: Regular rate and rhythm, no murmurs or gallops Breast:Examined in sitting and supine position were symmetrical in appearance, no palpable masses or tenderness,  no skin retraction, no nipple inversion, no nipple discharge, no skin discoloration, no axillary or supraclavicular lymphadenopathy Abdomen: no palpable masses or tenderness, no rebound or guarding Extremities: no edema or skin discoloration or tenderness  Pelvic:  Bartholin, Urethra, Skene Glands: Within normal limits             Vagina: No gross lesions or discharge  Cervix: No gross lesions or discharge  Uterus  AV, normal size, shape and consistency, non-tender and mobile  Adnexa  Without masses or tenderness  Anus and perineum  normal     Assessment/Plan:  41 y.o. female  for annual exam   1. Well female exam with routine gynecological exam Normal Gyn exam.  Pap normal/HPV HR neg 03/2016.  Breasts wnl.  Screening Mammo normal 03/2017 at [facility] per patient.  2. Encounter for surveillance of other contraceptive Declines.  H/O Infertility.  3. Class 1 obesity due to excess calories without serious comorbidity with body mass index (BMI) of 32.0 to 32.9 in adult Low Calorie diet and physical activity, including wt lifting discussed.  Counseling on above issues >50% x 10 minutes.  Ann Bruins MD, 4:18 PM 07/08/2017

## 2017-07-08 NOTE — Patient Instructions (Addendum)
1. Well female exam with routine gynecological exam Normal Gyn exam.  Pap normal/HPV HR neg 03/2016.  Breasts wnl.  Screening Mammo normal 03/2017 at [facility] per patient.  2. Encounter for surveillance of other contraceptive Declines.  H/O Infertility.  3. Class 1 obesity due to excess calories without serious comorbidity with body mass index (BMI) of 32.0 to 32.9 in adult Low Calorie diet and physical activity, including wt lifting discussed.   [name], it was a pleasure to see you today!  Health Maintenance, Female Adopting a healthy lifestyle and getting preventive care can go a long way to promote health and wellness. Talk with your health care provider about what schedule of regular examinations is right for you. This is a good chance for you to check in with your provider about disease prevention and staying healthy. In between checkups, there are plenty of things you can do on your own. Experts have done a lot of research about which lifestyle changes and preventive measures are most likely to keep you healthy. Ask your health care provider for more information. Weight and diet Eat a healthy diet  Be sure to include plenty of vegetables, fruits, low-fat dairy products, and lean protein.  Do not eat a lot of foods high in solid fats, added sugars, or salt.  Get regular exercise. This is one of the most important things you can do for your health. ? Most adults should exercise for at least 150 minutes each week. The exercise should increase your heart rate and make you sweat (moderate-intensity exercise). ? Most adults should also do strengthening exercises at least twice a week. This is in addition to the moderate-intensity exercise.  Maintain a healthy weight  Body mass index (BMI) is a measurement that can be used to identify possible weight problems. It estimates body fat based on height and weight. Your health care provider can help determine your BMI and help you achieve or maintain  a healthy weight.  For females 32 years of age and older: ? A BMI below 18.5 is considered underweight. ? A BMI of 18.5 to 24.9 is normal. ? A BMI of 25 to 29.9 is considered overweight. ? A BMI of 30 and above is considered obese.  Watch levels of cholesterol and blood lipids  You should start having your blood tested for lipids and cholesterol at 43 years of age, then have this test every 5 years.  You may need to have your cholesterol levels checked more often if: ? Your lipid or cholesterol levels are high. ? You are older than 43 years of age. ? You are at high risk for heart disease.  Cancer screening Lung Cancer  Lung cancer screening is recommended for adults 20-22 years old who are at high risk for lung cancer because of a history of smoking.  A yearly low-dose CT scan of the lungs is recommended for people who: ? Currently smoke. ? Have quit within the past 15 years. ? Have at least a 30-pack-year history of smoking. A pack year is smoking an average of one pack of cigarettes a day for 1 year.  Yearly screening should continue until it has been 15 years since you quit.  Yearly screening should stop if you develop a health problem that would prevent you from having lung cancer treatment.  Breast Cancer  Practice breast self-awareness. This means understanding how your breasts normally appear and feel.  It also means doing regular breast self-exams. Let your health care provider know  about any changes, no matter how small.  If you are in your 20s or 30s, you should have a clinical breast exam (CBE) by a health care provider every 1-3 years as part of a regular health exam.  If you are 14 or older, have a CBE every year. Also consider having a breast X-ray (mammogram) every year.  If you have a family history of breast cancer, talk to your health care provider about genetic screening.  If you are at high risk for breast cancer, talk to your health care provider about  having an MRI and a mammogram every year.  Breast cancer gene (BRCA) assessment is recommended for women who have family members with BRCA-related cancers. BRCA-related cancers include: ? Breast. ? Ovarian. ? Tubal. ? Peritoneal cancers.  Results of the assessment will determine the need for genetic counseling and BRCA1 and BRCA2 testing.  Cervical Cancer Your health care provider may recommend that you be screened regularly for cancer of the pelvic organs (ovaries, uterus, and vagina). This screening involves a pelvic examination, including checking for microscopic changes to the surface of your cervix (Pap test). You may be encouraged to have this screening done every 3 years, beginning at age 55.  For women ages 33-65, health care providers may recommend pelvic exams and Pap testing every 3 years, or they may recommend the Pap and pelvic exam, combined with testing for human papilloma virus (HPV), every 5 years. Some types of HPV increase your risk of cervical cancer. Testing for HPV may also be done on women of any age with unclear Pap test results.  Other health care providers may not recommend any screening for nonpregnant women who are considered low risk for pelvic cancer and who do not have symptoms. Ask your health care provider if a screening pelvic exam is right for you.  If you have had past treatment for cervical cancer or a condition that could lead to cancer, you need Pap tests and screening for cancer for at least 20 years after your treatment. If Pap tests have been discontinued, your risk factors (such as having a new sexual partner) need to be reassessed to determine if screening should resume. Some women have medical problems that increase the chance of getting cervical cancer. In these cases, your health care provider may recommend more frequent screening and Pap tests.  Colorectal Cancer  This type of cancer can be detected and often prevented.  Routine colorectal cancer  screening usually begins at 43 years of age and continues through 43 years of age.  Your health care provider may recommend screening at an earlier age if you have risk factors for colon cancer.  Your health care provider may also recommend using home test kits to check for hidden blood in the stool.  A small camera at the end of a tube can be used to examine your colon directly (sigmoidoscopy or colonoscopy). This is done to check for the earliest forms of colorectal cancer.  Routine screening usually begins at age 59.  Direct examination of the colon should be repeated every 5-10 years through 43 years of age. However, you may need to be screened more often if early forms of precancerous polyps or small growths are found.  Skin Cancer  Check your skin from head to toe regularly.  Tell your health care provider about any new moles or changes in moles, especially if there is a change in a mole's shape or color.  Also tell your health care  provider if you have a mole that is larger than the size of a pencil eraser.  Always use sunscreen. Apply sunscreen liberally and repeatedly throughout the day.  Protect yourself by wearing long sleeves, pants, a wide-brimmed hat, and sunglasses whenever you are outside.  Heart disease, diabetes, and high blood pressure  High blood pressure causes heart disease and increases the risk of stroke. High blood pressure is more likely to develop in: ? People who have blood pressure in the high end of the normal range (130-139/85-89 mm Hg). ? People who are overweight or obese. ? People who are African American.  If you are 54-47 years of age, have your blood pressure checked every 3-5 years. If you are 81 years of age or older, have your blood pressure checked every year. You should have your blood pressure measured twice-once when you are at a hospital or clinic, and once when you are not at a hospital or clinic. Record the average of the two measurements.  To check your blood pressure when you are not at a hospital or clinic, you can use: ? An automated blood pressure machine at a pharmacy. ? A home blood pressure monitor.  If you are between 38 years and 55 years old, ask your health care provider if you should take aspirin to prevent strokes.  Have regular diabetes screenings. This involves taking a blood sample to check your fasting blood sugar level. ? If you are at a normal weight and have a low risk for diabetes, have this test once every three years after 43 years of age. ? If you are overweight and have a high risk for diabetes, consider being tested at a younger age or more often. Preventing infection Hepatitis B  If you have a higher risk for hepatitis B, you should be screened for this virus. You are considered at high risk for hepatitis B if: ? You were born in a country where hepatitis B is common. Ask your health care provider which countries are considered high risk. ? Your parents were born in a high-risk country, and you have not been immunized against hepatitis B (hepatitis B vaccine). ? You have HIV or AIDS. ? You use needles to inject street drugs. ? You live with someone who has hepatitis B. ? You have had sex with someone who has hepatitis B. ? You get hemodialysis treatment. ? You take certain medicines for conditions, including cancer, organ transplantation, and autoimmune conditions.  Hepatitis C  Blood testing is recommended for: ? Everyone born from [birth year] through 1967. ? Anyone with known risk factors for hepatitis C.  Sexually transmitted infections (STIs)  You should be screened for sexually transmitted infections (STIs) including gonorrhea and chlamydia if: ? You are sexually active and are younger than 43 years of age. ? You are older than 42 years of age and your health care provider tells you that you are at risk for this type of infection. ? Your sexual activity has changed since you were last screened  and you are at an increased risk for chlamydia or gonorrhea. Ask your health care provider if you are at risk.  If you do not have HIV, but are at risk, it may be recommended that you take a prescription medicine daily to prevent HIV infection. This is called pre-exposure prophylaxis (PrEP). You are considered at risk if: ? You are sexually active and do not regularly use condoms or know the HIV status of your partner(s). ? You take  drugs by injection. ? You are sexually active with a partner who has HIV.  Talk with your health care provider about whether you are at high risk of being infected with HIV. If you choose to begin PrEP, you should first be tested for HIV. You should then be tested every 3 months for as long as you are taking PrEP. Pregnancy  If you are premenopausal and you may become pregnant, ask your health care provider about preconception counseling.  If you may become pregnant, take 400 to 800 micrograms (mcg) of folic acid every day.  If you want to prevent pregnancy, talk to your health care provider about birth control (contraception). Osteoporosis and menopause  Osteoporosis is a disease in which the bones lose minerals and strength with aging. This can result in serious bone fractures. Your risk for osteoporosis can be identified using a bone density scan.  If you are 7 years of age or older, or if you are at risk for osteoporosis and fractures, ask your health care provider if you should be screened.  Ask your health care provider whether you should take a calcium or vitamin D supplement to lower your risk for osteoporosis.  Menopause may have certain physical symptoms and risks.  Hormone replacement therapy may reduce some of these symptoms and risks. Talk to your health care provider about whether hormone replacement therapy is right for you. Follow these instructions at home:  Schedule regular health, dental, and eye exams.  Stay current with your  immunizations.  Do not use any tobacco products including cigarettes, chewing tobacco, or electronic cigarettes.  If you are pregnant, do not drink alcohol.  If you are breastfeeding, limit how much and how often you drink alcohol.  Limit alcohol intake to no more than 1 drink per day for nonpregnant women. One drink equals 12 ounces of beer, 5 ounces of wine, or 1 ounces of hard liquor.  Do not use street drugs.  Do not share needles.  Ask your health care provider for help if you need support or information about quitting drugs.  Tell your health care provider if you often feel depressed.  Tell your health care provider if you have ever been abused or do not feel safe at home. This information is not intended to replace advice given to you by your health care provider. Make sure you discuss any questions you have with your health care provider. Document Released: 07/01/2011 Document Revised: 05/23/2016 Document Reviewed: 09/19/2015 Elsevier Interactive Patient Education  [employer].

## 2017-07-24 MED FILL — HYDROCHLOROTHIAZIDE 25 MG T: 25 | 90 days supply | Qty: 90 | Fill #0

## 2017-07-29 DIAGNOSIS — M5136 Other intervertebral disc degeneration, lumbar region: Secondary | ICD-10-CM | POA: Diagnosis not present

## 2017-07-29 DIAGNOSIS — M7989 Other specified soft tissue disorders: Secondary | ICD-10-CM | POA: Diagnosis not present

## 2017-07-30 ENCOUNTER — Other Ambulatory Visit (HOSPITAL_COMMUNITY): Payer: Self-pay | Admitting: Physical Medicine and Rehabilitation

## 2017-07-30 DIAGNOSIS — R609 Edema, unspecified: Secondary | ICD-10-CM

## 2017-07-31 MED FILL — MELOXICAM 7.5 MG TABLET: 7.5 | 30 days supply | Qty: 60 | Fill #1

## 2017-08-01 ENCOUNTER — Ambulatory Visit (HOSPITAL_COMMUNITY)
Admission: RE | Admit: 2017-08-01 | Discharge: 2017-08-01 | Disposition: A | Discharge status: Home / Self Care | Payer: 59 | Source: Ambulatory Visit | Attending: Cardiovascular Disease | Admitting: Cardiovascular Disease

## 2017-08-01 DIAGNOSIS — R609 Edema, unspecified: Secondary | ICD-10-CM | POA: Insufficient documentation

## 2017-08-01 DIAGNOSIS — I10 Essential (primary) hypertension: Secondary | ICD-10-CM | POA: Insufficient documentation

## 2017-09-02 DIAGNOSIS — M5136 Other intervertebral disc degeneration, lumbar region: Secondary | ICD-10-CM | POA: Diagnosis not present

## 2017-10-06 MED FILL — MONTELUKAST SOD 10 MG TAB: 10 | 90 days supply | Qty: 90 | Fill #1

## 2017-10-09 DIAGNOSIS — G5603 Carpal tunnel syndrome, bilateral upper limbs: Secondary | ICD-10-CM | POA: Diagnosis not present

## 2017-10-09 DIAGNOSIS — M79642 Pain in left hand: Secondary | ICD-10-CM | POA: Diagnosis not present

## 2017-10-09 DIAGNOSIS — M79641 Pain in right hand: Secondary | ICD-10-CM | POA: Diagnosis not present

## 2017-10-20 DIAGNOSIS — M79642 Pain in left hand: Secondary | ICD-10-CM | POA: Diagnosis not present

## 2017-10-20 DIAGNOSIS — M79641 Pain in right hand: Secondary | ICD-10-CM | POA: Diagnosis not present

## 2017-10-20 MED FILL — MELOXICAM 7.5 MG TABLET: 7.5 | 30 days supply | Qty: 60 | Fill #0

## 2017-11-24 MED FILL — HYDROCHLOROTHIAZIDE 25 MG T: 25 | 90 days supply | Qty: 90 | Fill #1

## 2017-12-02 DIAGNOSIS — G5601 Carpal tunnel syndrome, right upper limb: Secondary | ICD-10-CM | POA: Diagnosis not present

## 2017-12-02 MED FILL — HYDROCODON-APAP 5-325: 5-325 | 4 days supply | Qty: 42 | Fill #0

## 2017-12-09 DIAGNOSIS — Z4789 Encounter for other orthopedic aftercare: Secondary | ICD-10-CM | POA: Diagnosis not present

## 2017-12-16 DIAGNOSIS — M25631 Stiffness of right wrist, not elsewhere classified: Secondary | ICD-10-CM | POA: Diagnosis not present

## 2017-12-31 DIAGNOSIS — M25531 Pain in right wrist: Secondary | ICD-10-CM | POA: Diagnosis not present

## 2017-12-31 DIAGNOSIS — G5601 Carpal tunnel syndrome, right upper limb: Secondary | ICD-10-CM | POA: Insufficient documentation

## 2018-01-12 MED FILL — MELOXICAM 7.5 MG TABLET: 7.5 | 30 days supply | Qty: 60 | Fill #1

## 2018-02-09 MED FILL — MONTELUKAST SOD 10 MG TAB: 10 | 90 days supply | Qty: 90 | Fill #2

## 2018-02-26 DIAGNOSIS — M5136 Other intervertebral disc degeneration, lumbar region: Secondary | ICD-10-CM | POA: Insufficient documentation

## 2018-03-12 DIAGNOSIS — M5136 Other intervertebral disc degeneration, lumbar region: Secondary | ICD-10-CM | POA: Diagnosis not present

## 2018-03-16 MED FILL — HYDROCHLOROTHIAZIDE 25 MG T: 25 | 90 days supply | Qty: 90 | Fill #2

## 2018-04-20 MED FILL — MELOXICAM 7.5 MG TABLET: 7.5 | 30 days supply | Qty: 60 | Fill #0

## 2018-04-21 DIAGNOSIS — R82998 Other abnormal findings in urine: Secondary | ICD-10-CM | POA: Diagnosis not present

## 2018-04-21 DIAGNOSIS — I10 Essential (primary) hypertension: Secondary | ICD-10-CM | POA: Diagnosis not present

## 2018-04-21 DIAGNOSIS — Z0000 Encounter for general adult medical examination without abnormal findings: Secondary | ICD-10-CM | POA: Diagnosis not present

## 2018-04-28 DIAGNOSIS — G5601 Carpal tunnel syndrome, right upper limb: Secondary | ICD-10-CM | POA: Diagnosis not present

## 2018-04-28 DIAGNOSIS — F321 Major depressive disorder, single episode, moderate: Secondary | ICD-10-CM | POA: Diagnosis not present

## 2018-04-28 DIAGNOSIS — E7800 Pure hypercholesterolemia, unspecified: Secondary | ICD-10-CM | POA: Diagnosis not present

## 2018-04-28 DIAGNOSIS — E668 Other obesity: Secondary | ICD-10-CM | POA: Diagnosis not present

## 2018-04-28 DIAGNOSIS — Z0000 Encounter for general adult medical examination without abnormal findings: Secondary | ICD-10-CM | POA: Diagnosis not present

## 2018-04-28 DIAGNOSIS — Z1389 Encounter for screening for other disorder: Secondary | ICD-10-CM | POA: Diagnosis not present

## 2018-04-28 DIAGNOSIS — J302 Other seasonal allergic rhinitis: Secondary | ICD-10-CM | POA: Diagnosis not present

## 2018-04-28 DIAGNOSIS — I10 Essential (primary) hypertension: Secondary | ICD-10-CM | POA: Diagnosis not present

## 2018-04-28 DIAGNOSIS — Z6831 Body mass index (BMI) 31.0-31.9, adult: Secondary | ICD-10-CM | POA: Diagnosis not present

## 2018-05-06 ENCOUNTER — Telehealth: Payer: Self-pay | Admitting: *Deleted

## 2018-05-06 ENCOUNTER — Ambulatory Visit: Payer: 59 | Admitting: Obstetrics & Gynecology

## 2018-05-06 NOTE — Telephone Encounter (Signed)
Patient has yeast infection itching and white discharge, was scheduled to come in for OV today, but canceled due to heavy cycle. She asked if diflucan tablet can be prescribed? Please advise

## 2018-05-07 MED ORDER — FLUCONAZOLE 150 MG PO TABS: 150.0000 mg | ORAL_TABLET | Freq: Every day | ORAL | 0 refills | Status: DC

## 2018-05-07 MED FILL — FLUCONAZOLE 150 MG TABS: 150 | 3 days supply | Qty: 3 | Fill #0

## 2018-05-07 NOTE — Telephone Encounter (Signed)
Pt informed Rx sent. 

## 2018-05-07 NOTE — Telephone Encounter (Signed)
Agree with Diflucan 150 mg 1 tab PO daily x 3.

## 2018-06-01 ENCOUNTER — Encounter: Payer: Self-pay | Admitting: Obstetrics & Gynecology

## 2018-06-01 DIAGNOSIS — Z1231 Encounter for screening mammogram for malignant neoplasm of breast: Secondary | ICD-10-CM | POA: Diagnosis not present

## 2018-06-02 ENCOUNTER — Other Ambulatory Visit: Payer: Self-pay | Admitting: Obstetrics & Gynecology

## 2018-06-02 ENCOUNTER — Telehealth: Payer: Self-pay

## 2018-06-02 DIAGNOSIS — N920 Excessive and frequent menstruation with regular cycle: Secondary | ICD-10-CM

## 2018-06-02 MED FILL — MONTELUKAST SOD 10 MG TAB: 10 | 90 days supply | Qty: 90 | Fill #3

## 2018-06-02 NOTE — Telephone Encounter (Signed)
Patient called about scheduling endometrial ablation and asked me to check insurance benefit for her.  I do not see mention in your recent office note from 06/2017 but the is a note from 2017 scanned in that mentioned disussing Novasure with her.  I am happy to check benefit but wondered how you would want to proceed. Will she need SHGM? U/S? Office visit?

## 2018-06-02 NOTE — Telephone Encounter (Signed)
Yes, please schedule Pelvic US/Preop visit with me.

## 2018-06-02 NOTE — Telephone Encounter (Signed)
Left message for patient to call me. U/S order placed.

## 2018-06-04 NOTE — Telephone Encounter (Signed)
Patient informed. Appt scheduled.

## 2018-07-13 MED FILL — MELOXICAM 7.5 MG TABLET: 7.5 | 90 days supply | Qty: 180 | Fill #0

## 2018-07-13 MED FILL — HYDROCHLOROTHIAZIDE 25 MG T: 25 | 90 days supply | Qty: 90 | Fill #3

## 2018-07-23 ENCOUNTER — Ambulatory Visit (INDEPENDENT_AMBULATORY_CARE_PROVIDER_SITE_OTHER): Payer: 59

## 2018-07-23 ENCOUNTER — Ambulatory Visit: Payer: 59 | Admitting: Obstetrics & Gynecology

## 2018-07-23 ENCOUNTER — Other Ambulatory Visit: Payer: Self-pay | Admitting: Obstetrics & Gynecology

## 2018-07-23 ENCOUNTER — Other Ambulatory Visit: Payer: Self-pay | Admitting: *Deleted

## 2018-07-23 ENCOUNTER — Encounter: Payer: Self-pay | Admitting: Obstetrics & Gynecology

## 2018-07-23 DIAGNOSIS — N920 Excessive and frequent menstruation with regular cycle: Secondary | ICD-10-CM

## 2018-07-23 DIAGNOSIS — D251 Intramural leiomyoma of uterus: Secondary | ICD-10-CM

## 2018-07-23 NOTE — Progress Notes (Signed)
    [name] [birth year] [id number]        42 y.o.  G4P0031   RP: Heavy menses x 04/2018  HPI: Heavy menses since May 2019.  Menses come every month, last 7 days with heavy flow especially the first 3 days, using 2 pads at a time with overflow.  No pelvic pain.  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   OB History  Gravida Para Term Preterm AB Living  4 1     3 1   SAB TAB Ectopic Multiple Live Births  3            # Outcome Date GA Lbr Len/2nd Weight Sex Delivery Anes PTL Lv  4 SAB           3 SAB           2 SAB           1 Para             Past medical history,surgical history, problem list, medications, allergies, family history and social history were all reviewed and documented in the EPIC chart.   Directed ROS with pertinent positives and negatives documented in the history of present illness/assessment and plan.  Exam:  There were no vitals filed for this visit. General appearance:  Normal  Pelvic ultrasound today: T/V images.  Anteverted heterogeneous uterus measuring 10.14 x 7.12 x 6.55 cm.  Intramural fibroids measuring 2.7 x 1.9 cm, 1.4 cm, 1.2 cm.  Endometrial line prominent on day 25 of cycle measured at 17.4 mm.  Right ovary normal.  Left ovary with an echo-free follicle measuring 1.9 x 2.4 cm.  No apparent mass in the right or left adnexa.  No free fluid in the posterior cul-de-sac.   Assessment/Plan:  42 y.o. G4P0031   1. Menorrhagia with regular cycle Menorrhagia since May 2019.  Endometrial line normal for day 25th of cycle.  Small intramural fibroids.  Medical/hormonal management of menorrhagia reviewed.  Birth control pills, Depo-Provera injections and progesterone IUD discussed more thoroughly with usage, risks and benefits.  Decision to proceed with Mirena IUD insertion.  Patient will schedule with next menstrual period.  Mirena IUD pamphlet given.  Surgical management of menorrhagia reviewed briefly, but not recommended at  this time.  2. Fibroids, intramural 3 small intramural fibroids seen on pelvic ultrasound today.  Counseling on above issues and coordination of care more than 50% for 15 minutes.  Princess Bruins MD, 3:45 PM 07/23/2018

## 2018-07-23 NOTE — Patient Instructions (Signed)
1. Menorrhagia with regular cycle Menorrhagia since May 2019.  Endometrial line normal for day 25th of cycle.  Small intramural fibroids.  Medical/hormonal management of menorrhagia reviewed.  Birth control pills, Depo-Provera injections and progesterone IUD discussed more thoroughly with usage, risks and benefits.  Decision to proceed with Mirena IUD insertion.  Patient will schedule with next menstrual period.  Mirena IUD pamphlet given.  Surgical management of menorrhagia reviewed briefly, but not recommended at this time.  2. Fibroids, intramural 3 small intramural fibroids seen on pelvic ultrasound today.  Tajanae, it was a pleasure seeing you today!

## 2018-08-05 ENCOUNTER — Ambulatory Visit: Payer: 59 | Admitting: Obstetrics & Gynecology

## 2018-09-15 ENCOUNTER — Encounter: Payer: 59 | Admitting: Obstetrics & Gynecology

## 2018-09-17 ENCOUNTER — Ambulatory Visit (INDEPENDENT_AMBULATORY_CARE_PROVIDER_SITE_OTHER): Payer: 59 | Admitting: Obstetrics & Gynecology

## 2018-09-17 ENCOUNTER — Encounter: Payer: Self-pay | Admitting: Obstetrics & Gynecology

## 2018-09-17 VITALS — BP 132/76 | Ht 66.25 in | Wt 200.0 lb

## 2018-09-17 DIAGNOSIS — N920 Excessive and frequent menstruation with regular cycle: Secondary | ICD-10-CM | POA: Diagnosis not present

## 2018-09-17 DIAGNOSIS — Z30011 Encounter for initial prescription of contraceptive pills: Secondary | ICD-10-CM | POA: Diagnosis not present

## 2018-09-17 DIAGNOSIS — Z01419 Encounter for gynecological examination (general) (routine) without abnormal findings: Secondary | ICD-10-CM | POA: Diagnosis not present

## 2018-09-17 DIAGNOSIS — D219 Benign neoplasm of connective and other soft tissue, unspecified: Secondary | ICD-10-CM | POA: Diagnosis not present

## 2018-09-17 MED ORDER — NORETHIN ACE-ETH ESTRAD-FE 1-20 MG-MCG(24) PO TABS: 1.0000 | ORAL_TABLET | Freq: Every day | ORAL | 4 refills | Status: DC

## 2018-09-17 MED FILL — BLISOVI 24 FE TABLET: 1-20 | 84 days supply | Qty: 84 | Fill #0

## 2018-09-17 NOTE — Progress Notes (Signed)
[name] 04/23/1976 [id number]   History:    42 y.o. G4P1A3L1  Married.  Son is 24 yo, 5th grade.  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  RP:  Established patient presenting for annual gyn exam   HPI: Continued heavy periods every month.  No breakthrough bleeding.  No pelvic pain.  No pain with intercourse.  Urine and bowel movements normal.  Breasts normal.  Health labs with family physician.  Past medical history,surgical history, family history and social history were all reviewed and documented in the EPIC chart.  Gynecologic History Patient's last menstrual period was 09/04/2018. Contraception: none/H/O difficulty conceiving Last Pap: 2017. Results were: normal Last mammogram: 05/2018. Results were: Negative Bone Density: Never Colonoscopy: Never  Obstetric History OB History  Gravida Para Term Preterm AB Living  4 1     3 1   SAB TAB Ectopic Multiple Live Births  3            # Outcome Date GA Lbr Len/2nd Weight Sex Delivery Anes PTL Lv  4 SAB           3 SAB           2 SAB           1 Para              ROS: A ROS was performed and pertinent positives and negatives are included in the history.  GENERAL: No fevers or chills. HEENT: No change in vision, no earache, sore throat or sinus congestion. NECK: No pain or stiffness. CARDIOVASCULAR: No chest pain or pressure. No palpitations. PULMONARY: No shortness of breath, cough or wheeze. GASTROINTESTINAL: No abdominal pain, nausea, vomiting or diarrhea, melena or bright red blood per rectum. GENITOURINARY: No urinary frequency, urgency, hesitancy or dysuria. MUSCULOSKELETAL: No joint or muscle pain, no back pain, no recent trauma. DERMATOLOGIC: No rash, no itching, no lesions. ENDOCRINE: No polyuria, polydipsia, no heat or cold intolerance. No recent change in weight. HEMATOLOGICAL: No anemia or easy bruising or bleeding. NEUROLOGIC: No headache, seizures, numbness, tingling or weakness. PSYCHIATRIC: No  depression, no loss of interest in normal activity or change in sleep pattern.     Exam:   BP 132/76   Ht 5' 6.25" (1.683 m)   Wt 200 lb (90.7 kg)   LMP 09/04/2018   BMI 32.04 kg/m   Body mass index is 32.04 kg/m.  General appearance : Well developed well nourished female. No acute distress HEENT: Eyes: no retinal hemorrhage or exudates,  Neck supple, trachea midline, no carotid bruits, no thyroidmegaly Lungs: Clear to auscultation, no rhonchi or wheezes, or rib retractions  Heart: Regular rate and rhythm, no murmurs or gallops Breast:Examined in sitting and supine position were symmetrical in appearance, no palpable masses or tenderness,  no skin retraction, no nipple inversion, no nipple discharge, no skin discoloration, no axillary or supraclavicular lymphadenopathy Abdomen: no palpable masses or tenderness, no rebound or guarding Extremities: no edema or skin discoloration or tenderness  Pelvic: Vulva: Normal             Vagina: No gross lesions or discharge  Cervix: No gross lesions or discharge.  Pap reflex done  Uterus  AV, normal size, shape and consistency, non-tender and mobile  Adnexa  Without masses or tenderness  Anus: Normal  Pelvic US 07/23/2018: T/V images. Anteverted heterogeneous uterus measuring 10.14 x 7.12 x 6.55 cm. Intramural fibroids measuring 2.7 x 1.9 cm, 1.4 cm, 1.2 cm. Endometrial line prominent  on day 25 of cycle measured at 17.4 mm. Right ovary normal. Left ovary with anecho-free follicle measuring 1.9 x 2.4 cm. No apparent mass in the right or left adnexa. No free fluid in the posterior cul-de-sac.    Assessment/Plan:  42 y.o. female for annual exam   1. Encounter for routine gynecological examination with Papanicolaou smear of cervix Stable gynecologic exam with uterine fibroids.  Pap reflex done.  Breast exam normal.  Screening mammogram was negative in June 2019.  Health labs with family physician.  2. Menorrhagia with regular  cycle Heavy periods associated with uterine fibroids.  Decision to control with a low-dose birth control pill.  No contraindication.  Usage reviewed and prescription sent to pharmacy.  3. Encounter for initial prescription of contraceptive pills As above.  4. Fibroids Documented by recent pelvic ultrasound July 2019.  Other orders - Norethindrone Acetate-Ethinyl Estrad-FE (LOESTRIN 24 FE) 1-20 MG-MCG(24) tablet; Take 1 tablet by mouth daily.  Counseling on above issues and coordination of care more than 50% for 10 minutes.  Princess Bruins MD, 3:38 PM 09/17/2018

## 2018-09-18 LAB — PAP IG W/ RFLX HPV ASCU

## 2018-09-20 ENCOUNTER — Encounter: Payer: Self-pay | Admitting: Obstetrics & Gynecology

## 2018-09-20 NOTE — Patient Instructions (Signed)
1. Encounter for routine gynecological examination with Papanicolaou smear of cervix Stable gynecologic exam with uterine fibroids.  Pap reflex done.  Breast exam normal.  Screening mammogram was negative in June 2019.  Health labs with family physician.  2. Menorrhagia with regular cycle Heavy periods associated with uterine fibroids.  Decision to control with a low-dose birth control pill.  No contraindication.  Usage reviewed and prescription sent to pharmacy.  3. Encounter for initial prescription of contraceptive pills As above.  4. Fibroids Documented by recent pelvic ultrasound July 2019.  Other orders - Norethindrone Acetate-Ethinyl Estrad-FE (LOESTRIN 24 FE) 1-20 MG-MCG(24) tablet; Take 1 tablet by mouth daily.  [name], it was a pleasure seeing you today!

## 2018-10-01 MED FILL — MONTELUKAST SOD 10 MG TAB: 10 | 90 days supply | Qty: 90 | Fill #0

## 2018-11-09 MED FILL — HYDROCHLOROTHIAZIDE 25 MG T: 25 | 90 days supply | Qty: 90 | Fill #0

## 2018-12-14 MED FILL — BLISOVI 24 FE TABLET: 1-20 | 84 days supply | Qty: 84 | Fill #1

## 2019-01-26 MED FILL — MONTELUKAST SOD 10 MG TAB: 10 | 90 days supply | Qty: 90 | Fill #1

## 2019-02-26 MED FILL — HYDROCHLOROTHIAZIDE 25 MG T: 25 | 90 days supply | Qty: 90 | Fill #1

## 2019-03-01 MED FILL — MELOXICAM 7.5 MG TABLET: 7.5 | 90 days supply | Qty: 180 | Fill #1

## 2019-03-03 MED FILL — CHLORHEXIDINE 0.12% RINSE: 0.12 | 17 days supply | Qty: 473 | Fill #0

## 2019-03-09 MED FILL — BLISOVI 24 FE 1-20 MG-MCG(2: 1-20 | 84 days supply | Qty: 84 | Fill #2

## 2019-04-27 DIAGNOSIS — Z0000 Encounter for general adult medical examination without abnormal findings: Secondary | ICD-10-CM | POA: Diagnosis not present

## 2019-04-28 DIAGNOSIS — R82998 Other abnormal findings in urine: Secondary | ICD-10-CM | POA: Diagnosis not present

## 2019-04-28 DIAGNOSIS — I10 Essential (primary) hypertension: Secondary | ICD-10-CM | POA: Diagnosis not present

## 2019-05-04 DIAGNOSIS — Z1331 Encounter for screening for depression: Secondary | ICD-10-CM | POA: Diagnosis not present

## 2019-05-04 DIAGNOSIS — Z1339 Encounter for screening examination for other mental health and behavioral disorders: Secondary | ICD-10-CM | POA: Diagnosis not present

## 2019-05-04 DIAGNOSIS — E669 Obesity, unspecified: Secondary | ICD-10-CM | POA: Diagnosis not present

## 2019-05-04 DIAGNOSIS — J302 Other seasonal allergic rhinitis: Secondary | ICD-10-CM | POA: Diagnosis not present

## 2019-05-04 DIAGNOSIS — E7800 Pure hypercholesterolemia, unspecified: Secondary | ICD-10-CM | POA: Diagnosis not present

## 2019-05-04 DIAGNOSIS — F325 Major depressive disorder, single episode, in full remission: Secondary | ICD-10-CM | POA: Diagnosis not present

## 2019-05-04 DIAGNOSIS — I10 Essential (primary) hypertension: Secondary | ICD-10-CM | POA: Diagnosis not present

## 2019-05-04 DIAGNOSIS — Z0000 Encounter for general adult medical examination without abnormal findings: Secondary | ICD-10-CM | POA: Diagnosis not present

## 2019-05-04 DIAGNOSIS — M545 Low back pain: Secondary | ICD-10-CM | POA: Diagnosis not present

## 2019-05-17 MED FILL — MOMETASONE FUROATE 50 MCG S: 50 | 90 days supply | Qty: 51 | Fill #0

## 2019-05-17 MED FILL — MONTELUKAST SOD 10 MG TAB: 10 | 90 days supply | Qty: 90 | Fill #0

## 2019-05-31 MED FILL — BLISOVI 24 FE 1-20 MG-MCG(2: 1-20 | 84 days supply | Qty: 84 | Fill #3

## 2019-06-17 MED FILL — HYDROCHLOROTHIAZIDE 25 MG T: 25 | 90 days supply | Qty: 90 | Fill #0

## 2019-06-23 ENCOUNTER — Encounter: Payer: Self-pay | Admitting: Obstetrics & Gynecology

## 2019-06-23 DIAGNOSIS — Z1231 Encounter for screening mammogram for malignant neoplasm of breast: Secondary | ICD-10-CM | POA: Diagnosis not present

## 2019-06-23 DIAGNOSIS — Z803 Family history of malignant neoplasm of breast: Secondary | ICD-10-CM | POA: Diagnosis not present

## 2019-08-25 MED FILL — MONTELUKAST SOD 10 MG TAB: 10 | 90 days supply | Qty: 90 | Fill #0

## 2019-08-25 MED FILL — BLISOVI 24 FE 1-20 MG-MCG(2: 1-20 | 84 days supply | Qty: 84 | Fill #4

## 2019-09-20 MED FILL — MELOXICAM 7.5 MG TABLET: 7.5 | 90 days supply | Qty: 180 | Fill #0

## 2019-09-20 MED FILL — HYDROCHLOROTHIAZIDE 25 MG T: 25 | 90 days supply | Qty: 90 | Fill #1

## 2019-09-22 ENCOUNTER — Other Ambulatory Visit: Payer: Self-pay

## 2019-09-23 ENCOUNTER — Encounter: Payer: Self-pay | Admitting: Obstetrics & Gynecology

## 2019-09-23 ENCOUNTER — Ambulatory Visit (INDEPENDENT_AMBULATORY_CARE_PROVIDER_SITE_OTHER): Payer: 59 | Admitting: Obstetrics & Gynecology

## 2019-09-23 VITALS — BP 140/86 | Ht 66.5 in | Wt 198.0 lb

## 2019-09-23 DIAGNOSIS — E6609 Other obesity due to excess calories: Secondary | ICD-10-CM | POA: Diagnosis not present

## 2019-09-23 DIAGNOSIS — Z01419 Encounter for gynecological examination (general) (routine) without abnormal findings: Secondary | ICD-10-CM

## 2019-09-23 DIAGNOSIS — Z3041 Encounter for surveillance of contraceptive pills: Secondary | ICD-10-CM

## 2019-09-23 DIAGNOSIS — Z6831 Body mass index (BMI) 31.0-31.9, adult: Secondary | ICD-10-CM | POA: Diagnosis not present

## 2019-09-23 MED ORDER — NORETHIN ACE-ETH ESTRAD-FE 1-20 MG-MCG(24) PO TABS: 1.0000 | ORAL_TABLET | Freq: Every day | ORAL | 4 refills | Status: DC

## 2019-09-23 NOTE — Progress Notes (Signed)
[name] [birth year] [id number]   History:    43 y.o. 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    RP:  Established patient presenting for annual gyn exam   HPI: Well on LoEstrin 24 Fe 1/20 generic.  Lighter menstrual flow.  No BTB.  No pelvic pain.  No pain with IC.  Urine/BMs normal.  Breasts normal.  BMI 31.48.  Had gained weight since last yr.  Lost 49 Lbs recently with [employer].  Walking.  Health Labs with Dr Osborne Casco.  Past medical history,surgical history, family history and social history were all reviewed and documented in the EPIC chart.  Gynecologic History Patient's last menstrual period was 09/02/2019. Contraception: OCP (estrogen/progesterone) Last Pap: 08/2018. Results were: Negative Last mammogram: 05/2019. Results were: Negative per patient, will obtain report Bone Density: Never Colonoscopy: Never  Obstetric History OB History  Gravida Para Term Preterm AB Living  4 1     3 1   SAB TAB Ectopic Multiple Live Births  3            # Outcome Date GA Lbr Len/2nd Weight Sex Delivery Anes PTL Lv  4 SAB           3 SAB           2 SAB           1 Para              ROS: A ROS was performed and pertinent positives and negatives are included in the history.  GENERAL: No fevers or chills. HEENT: No change in vision, no earache, sore throat or sinus congestion. NECK: No pain or stiffness. CARDIOVASCULAR: No chest pain or pressure. No palpitations. PULMONARY: No shortness of breath, cough or wheeze. GASTROINTESTINAL: No abdominal pain, nausea, vomiting or diarrhea, melena or bright red blood per rectum. GENITOURINARY: No urinary frequency, urgency, hesitancy or dysuria. MUSCULOSKELETAL: No joint or muscle pain, no back pain, no recent trauma. DERMATOLOGIC: No rash, no itching, no lesions. ENDOCRINE: No polyuria, polydipsia, no heat or cold intolerance. No recent change in weight. HEMATOLOGICAL: No anemia or easy bruising or bleeding.  NEUROLOGIC: No headache, seizures, numbness, tingling or weakness. PSYCHIATRIC: No depression, no loss of interest in normal activity or change in sleep pattern.     Exam:   BP 140/86   Ht 5' 6.5" (1.689 m)   Wt 198 lb (89.8 kg)   LMP 09/02/2019 Comment: pill  BMI 31.48 kg/m   Body mass index is 31.48 kg/m.  General appearance : Well developed well nourished female. No acute distress HEENT: Eyes: no retinal hemorrhage or exudates,  Neck supple, trachea midline, no carotid bruits, no thyroidmegaly Lungs: Clear to auscultation, no rhonchi or wheezes, or rib retractions  Heart: Regular rate and rhythm, no murmurs or gallops Breast:Examined in sitting and supine position were symmetrical in appearance, no palpable masses or tenderness,  no skin retraction, no nipple inversion, no nipple discharge, no skin discoloration, no axillary or supraclavicular lymphadenopathy Abdomen: no palpable masses or tenderness, no rebound or guarding Extremities: no edema or skin discoloration or tenderness  Pelvic: Vulva: Normal             Vagina: No gross lesions or discharge  Cervix: No gross lesions or discharge  Uterus  AV, normal size, shape and consistency, non-tender and mobile  Adnexa  Without masses or tenderness  Anus: Normal   Assessment/Plan:  43 y.o. female for annual exam  1. Well female exam with routine gynecological exam Normal gynecologic exam.  Pap test September 2019 was negative, no indication to repeat this year.  Breast exam normal.  Screening mammogram June 2020 was negative per patient, will obtain report.  Health labs with family physician.  2. Encounter for surveillance of contraceptive pills Well on Loestrin 24 FE 1/20 generic.  Lighter periods since started on birth control pills, controlling heavy periods.  No contraindication to continue.  Prescription sent to pharmacy  3. Class 1 obesity due to excess calories without serious comorbidity with body mass index (BMI)  of 31.0 to 31.9 in adult Started Weight [employer], lost 20 Lbs so far.  Continue with low calorie/carb diet.  Aerobic physical activities 5 times a week and weight lifting every 2 days.  Other orders - Norethindrone Acetate-Ethinyl Estrad-FE (LOESTRIN 24 FE) 1-20 MG-MCG(24) tablet; Take 1 tablet by mouth daily.  Princess Bruins MD, 3:43 PM 09/23/2019

## 2019-09-23 NOTE — Patient Instructions (Signed)
1. Well female exam with routine gynecological exam Normal gynecologic exam.  Pap test September 2019 was negative, no indication to repeat this year.  Breast exam normal.  Screening mammogram June 2020 was negative per patient, will obtain report.  Health labs with family physician.  2. Encounter for surveillance of contraceptive pills Well on Loestrin 24 FE 1/20 generic.  Lighter periods since started on birth control pills, controlling heavy periods.  No contraindication to continue.  Prescription sent to pharmacy  3. Class 1 obesity due to excess calories without serious comorbidity with body mass index (BMI) of 31.0 to 31.9 in adult Started Weight [employer], lost 20 Lbs so far.  Continue with low calorie/carb diet.  Aerobic physical activities 5 times a week and weight lifting every 2 days.  Other orders - Norethindrone Acetate-Ethinyl Estrad-FE (LOESTRIN 24 FE) 1-20 MG-MCG(24) tablet; Take 1 tablet by mouth daily.  [name], it was a pleasure seeing you today!

## 2019-11-15 MED FILL — BLISOVI 24 FE 1-20 MG-MCG(2: 1-20 | 84 days supply | Qty: 84 | Fill #0

## 2019-12-09 DIAGNOSIS — H52223 Regular astigmatism, bilateral: Secondary | ICD-10-CM | POA: Diagnosis not present

## 2019-12-09 DIAGNOSIS — H5213 Myopia, bilateral: Secondary | ICD-10-CM | POA: Diagnosis not present

## 2019-12-09 DIAGNOSIS — H524 Presbyopia: Secondary | ICD-10-CM | POA: Diagnosis not present

## 2019-12-13 MED FILL — MONTELUKAST SOD 10 MG TAB: 10 | 90 days supply | Qty: 90 | Fill #1

## 2020-01-04 MED FILL — HYDROCHLOROTHIAZIDE 25 MG T: 25 | 90 days supply | Qty: 90 | Fill #2

## 2020-01-11 DIAGNOSIS — M5136 Other intervertebral disc degeneration, lumbar region: Secondary | ICD-10-CM | POA: Diagnosis not present

## 2020-02-09 MED FILL — BLISOVI 24 FE 1-20 MG-MCG(2: 1-20 | 84 days supply | Qty: 84 | Fill #1

## 2020-03-31 MED FILL — MONTELUKAST SOD 10 MG TAB: 10 | 90 days supply | Qty: 90 | Fill #2

## 2020-04-21 MED FILL — BLISOVI 24 FE 1-20 MG-MCG(2: 1-20 | 84 days supply | Qty: 84 | Fill #2

## 2020-04-21 MED FILL — HYDROCHLOROTHIAZIDE 25 MG T: 25 | 90 days supply | Qty: 90 | Fill #3

## 2020-04-21 MED FILL — MELOXICAM 7.5 MG TABLET: 7.5 | 90 days supply | Qty: 180 | Fill #1

## 2020-05-01 DIAGNOSIS — E7800 Pure hypercholesterolemia, unspecified: Secondary | ICD-10-CM | POA: Diagnosis not present

## 2020-05-01 DIAGNOSIS — Z0000 Encounter for general adult medical examination without abnormal findings: Secondary | ICD-10-CM | POA: Diagnosis not present

## 2020-05-05 DIAGNOSIS — R82998 Other abnormal findings in urine: Secondary | ICD-10-CM | POA: Diagnosis not present

## 2020-05-05 DIAGNOSIS — I10 Essential (primary) hypertension: Secondary | ICD-10-CM | POA: Diagnosis not present

## 2020-05-08 ENCOUNTER — Other Ambulatory Visit (HOSPITAL_COMMUNITY): Payer: Self-pay | Admitting: Internal Medicine

## 2020-05-08 DIAGNOSIS — J302 Other seasonal allergic rhinitis: Secondary | ICD-10-CM | POA: Diagnosis not present

## 2020-05-08 DIAGNOSIS — I10 Essential (primary) hypertension: Secondary | ICD-10-CM | POA: Diagnosis not present

## 2020-05-08 DIAGNOSIS — Z1339 Encounter for screening examination for other mental health and behavioral disorders: Secondary | ICD-10-CM | POA: Diagnosis not present

## 2020-05-08 DIAGNOSIS — M5431 Sciatica, right side: Secondary | ICD-10-CM | POA: Diagnosis not present

## 2020-05-08 DIAGNOSIS — Z0000 Encounter for general adult medical examination without abnormal findings: Secondary | ICD-10-CM | POA: Diagnosis not present

## 2020-05-08 DIAGNOSIS — E669 Obesity, unspecified: Secondary | ICD-10-CM | POA: Diagnosis not present

## 2020-05-08 DIAGNOSIS — F325 Major depressive disorder, single episode, in full remission: Secondary | ICD-10-CM | POA: Diagnosis not present

## 2020-05-08 DIAGNOSIS — E7800 Pure hypercholesterolemia, unspecified: Secondary | ICD-10-CM | POA: Diagnosis not present

## 2020-05-08 DIAGNOSIS — Z1331 Encounter for screening for depression: Secondary | ICD-10-CM | POA: Diagnosis not present

## 2020-06-05 DIAGNOSIS — G5602 Carpal tunnel syndrome, left upper limb: Secondary | ICD-10-CM | POA: Diagnosis not present

## 2020-06-05 DIAGNOSIS — M79642 Pain in left hand: Secondary | ICD-10-CM | POA: Diagnosis not present

## 2020-06-28 ENCOUNTER — Encounter: Payer: Self-pay | Admitting: Obstetrics & Gynecology

## 2020-06-28 DIAGNOSIS — Z1231 Encounter for screening mammogram for malignant neoplasm of breast: Secondary | ICD-10-CM | POA: Diagnosis not present

## 2020-07-26 MED FILL — MONTELUKAST SOD 10 MG TAB: 10 | 90 days supply | Qty: 90 | Fill #0

## 2020-07-29 MED FILL — TARINA 24 FE 1-20 MG-MCG(24: 1-20 | 84 days supply | Qty: 84 | Fill #0

## 2020-08-11 MED FILL — MONTELUKAST SOD 10 MG TAB: 10 | 90 days supply | Qty: 90 | Fill #0

## 2020-08-21 ENCOUNTER — Other Ambulatory Visit (HOSPITAL_COMMUNITY): Payer: Self-pay | Admitting: Internal Medicine

## 2020-08-21 MED FILL — HYDROCHLOROTHIAZIDE 25 MG T: 25 | 90 days supply | Qty: 90 | Fill #0

## 2020-09-01 ENCOUNTER — Telehealth: Payer: Self-pay | Admitting: *Deleted

## 2020-09-01 MED ORDER — SULFAMETHOXAZOLE-TRIMETHOPRIM 800-160 MG PO TABS: 1.0000 | ORAL_TABLET | Freq: Two times a day (BID) | ORAL | 0 refills | Status: DC

## 2020-09-01 MED FILL — SULFAMETHOXAZOLE-TMP DS TAB: 800-160 | 3 days supply | Qty: 6 | Fill #0

## 2020-09-01 NOTE — Addendum Note (Signed)
Addended by: Thamas Jaegers on: 09/01/2020 03:57 PM   Modules accepted: Orders

## 2020-09-01 NOTE — Telephone Encounter (Signed)
Patient informed, Rx sent.  

## 2020-09-01 NOTE — Telephone Encounter (Signed)
Patient called c/o possible UTI frequent urination, lower back discomfort and lower abdominal discomfort. Asked if Rx could be sent to pharmacy? Please advise

## 2020-09-01 NOTE — Telephone Encounter (Signed)
Agree with Bactrim DS 1 tab PO BID x 3 days.  #6, no refill.  Urgent care for U/A and U. Culture if not improved after 48 hours.

## 2020-10-19 ENCOUNTER — Other Ambulatory Visit: Payer: Self-pay | Admitting: Obstetrics & Gynecology

## 2020-10-19 ENCOUNTER — Other Ambulatory Visit: Payer: Self-pay | Admitting: *Deleted

## 2020-10-19 MED FILL — BLISOVI 24 FE 1-20 MG-MCG(2: 1-20 | 84 days supply | Qty: 84 | Fill #0

## 2020-11-02 DIAGNOSIS — M5136 Other intervertebral disc degeneration, lumbar region: Secondary | ICD-10-CM | POA: Diagnosis not present

## 2020-12-01 MED FILL — HYDROCHLOROTHIAZIDE 25 MG T: 25 | 90 days supply | Qty: 90 | Fill #1

## 2020-12-01 MED FILL — MELOXICAM 7.5 MG TABLET: 7.5 | 90 days supply | Qty: 180 | Fill #0

## 2020-12-01 MED FILL — MONTELUKAST SOD 10 MG TAB: 10 | 90 days supply | Qty: 90 | Fill #1

## 2021-01-12 ENCOUNTER — Other Ambulatory Visit: Payer: Self-pay

## 2021-01-12 ENCOUNTER — Encounter: Payer: Self-pay | Admitting: Obstetrics & Gynecology

## 2021-01-12 ENCOUNTER — Other Ambulatory Visit: Payer: Self-pay | Admitting: Obstetrics & Gynecology

## 2021-01-12 ENCOUNTER — Ambulatory Visit (INDEPENDENT_AMBULATORY_CARE_PROVIDER_SITE_OTHER): Payer: 59 | Admitting: Obstetrics & Gynecology

## 2021-01-12 VITALS — BP 140/88 | Ht 66.25 in | Wt 188.0 lb

## 2021-01-12 DIAGNOSIS — Z01419 Encounter for gynecological examination (general) (routine) without abnormal findings: Secondary | ICD-10-CM | POA: Diagnosis not present

## 2021-01-12 DIAGNOSIS — R300 Dysuria: Secondary | ICD-10-CM

## 2021-01-12 DIAGNOSIS — E6609 Other obesity due to excess calories: Secondary | ICD-10-CM

## 2021-01-12 DIAGNOSIS — Z3041 Encounter for surveillance of contraceptive pills: Secondary | ICD-10-CM | POA: Diagnosis not present

## 2021-01-12 DIAGNOSIS — Z6830 Body mass index (BMI) 30.0-30.9, adult: Secondary | ICD-10-CM | POA: Diagnosis not present

## 2021-01-12 LAB — URINALYSIS, COMPLETE W/RFL CULTURE
Bacteria, UA: NONE SEEN /HPF
Bilirubin Urine: NEGATIVE
Glucose, UA: NEGATIVE
Hgb urine dipstick: NEGATIVE
Hyaline Cast: NONE SEEN /LPF
Ketones, ur: NEGATIVE
Leukocyte Esterase: NEGATIVE
Nitrites, Initial: NEGATIVE
Protein, ur: NEGATIVE
RBC / HPF: NONE SEEN /HPF (ref 0–2)
Specific Gravity, Urine: 1.025 (ref 1.001–1.03)
WBC, UA: NONE SEEN /HPF (ref 0–5)
pH: 5.5 (ref 5.0–8.0)

## 2021-01-12 LAB — NO CULTURE INDICATED

## 2021-01-12 MED ORDER — BLISOVI 24 FE 1-20 MG-MCG(24) PO TABS
1.0000 | ORAL_TABLET | Freq: Every day | ORAL | 4 refills | Status: DC
Start: 2021-01-12 — End: 2021-01-12

## 2021-01-12 MED FILL — BLISOVI 24 FE 1-20 MG-MCG(2: 1-20 | 84 days supply | Qty: 84 | Fill #0

## 2021-01-12 NOTE — Progress Notes (Signed)
[name] 09-14-1976 [id number]   History:    45 y.o. 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    RP:  Established patient presenting for annual gyn exam   HPI: Well on LoEstrin 24 Fe 1/20 generic.  Lighter menstrual flow.  No BTB.  No pelvic pain.  No pain with IC.  Some irritation with urination.  Urine/BMs normal.  Breasts normal.  BMI decreased to 30.12.  On a low Calorie/Carb diet.  Walking.  Health Labs with Dr Osborne Casco.  Past medical history,surgical history, family history and social history were all reviewed and documented in the EPIC chart.  Gynecologic History Patient's last menstrual period was 01/13/2020.  Obstetric History OB History  Gravida Para Term Preterm AB Living  4 1     3 1   SAB IAB Ectopic Multiple Live Births  3            # Outcome Date GA Lbr Len/2nd Weight Sex Delivery Anes PTL Lv  4 SAB           3 SAB           2 SAB           1 Para              ROS: A ROS was performed and pertinent positives and negatives are included in the history.  GENERAL: No fevers or chills. HEENT: No change in vision, no earache, sore throat or sinus congestion. NECK: No pain or stiffness. CARDIOVASCULAR: No chest pain or pressure. No palpitations. PULMONARY: No shortness of breath, cough or wheeze. GASTROINTESTINAL: No abdominal pain, nausea, vomiting or diarrhea, melena or bright red blood per rectum. GENITOURINARY: No urinary frequency, urgency, hesitancy or dysuria. MUSCULOSKELETAL: No joint or muscle pain, no back pain, no recent trauma. DERMATOLOGIC: No rash, no itching, no lesions. ENDOCRINE: No polyuria, polydipsia, no heat or cold intolerance. No recent change in weight. HEMATOLOGICAL: No anemia or easy bruising or bleeding. NEUROLOGIC: No headache, seizures, numbness, tingling or weakness. PSYCHIATRIC: No depression, no loss of interest in normal activity or change in sleep pattern.     Exam:   Ht 5' 6.25" (1.683 m)   Wt 188 lb  (85.3 kg)   LMP 01/13/2020   BMI 30.12 kg/m   Body mass index is 30.12 kg/m.  General appearance : Well developed well nourished female. No acute distress HEENT: Eyes: no retinal hemorrhage or exudates,  Neck supple, trachea midline, no carotid bruits, no thyroidmegaly Lungs: Clear to auscultation, no rhonchi or wheezes, or rib retractions  Heart: Regular rate and rhythm, no murmurs or gallops Breast:Examined in sitting and supine position were symmetrical in appearance, no palpable masses or tenderness,  no skin retraction, no nipple inversion, no nipple discharge, no skin discoloration, no axillary or supraclavicular lymphadenopathy Abdomen: no palpable masses or tenderness, no rebound or guarding Extremities: no edema or skin discoloration or tenderness  Pelvic: Vulva: Normal             Vagina: No gross lesions or discharge  Cervix: No gross lesions or discharge.  Pap reflex done.  Uterus  AV, normal size, shape and consistency, non-tender and mobile  Adnexa  Without masses or tenderness  Anus: Normal  U/A Negative   Assessment/Plan:  45 y.o. female for annual exam   1. Encounter for routine gynecological examination with Papanicolaou smear of cervix Normal gynecologic exam.  Pap reflex done.  Breast exam normal.  Screening mammogram negative in June 2021.  Health labs with Dr. Osborne Casco.  2. Encounter for surveillance of contraceptive pills Well on birth control pills with norethindrone acetate ethynyl estradiol FE 24 1-20.  No contraindication to continue.  Prescription sent to pharmacy.  3. Dysuria Urine analysis negative.  Patient reassured. - Urinalysis,Complete w/RFL Culture - Pap IG w/ reflex to HPV when ASC-U  4. Class 1 obesity due to excess calories without serious comorbidity with body mass index (BMI) of 30.0 to 30.9 in adult Good weight loss on a lower calorie/carb nutrition plan.  Aerobic activities 5 times a week and light weightlifting every 2 days.  Other  orders - mometasone (NASONEX) 50 MCG/ACT nasal spray; Place 2 sprays into the nose daily. - Norethindrone Acetate-Ethinyl Estrad-FE (BLISOVI 24 FE) 1-20 MG-MCG(24) tablet; Take 1 tablet by mouth daily.  Princess Bruins MD, 3:01 PM 01/12/2021

## 2021-01-15 LAB — PAP IG W/ RFLX HPV ASCU

## 2021-01-23 ENCOUNTER — Other Ambulatory Visit: Payer: 59

## 2021-01-23 DIAGNOSIS — Z20822 Contact with and (suspected) exposure to covid-19: Secondary | ICD-10-CM | POA: Diagnosis not present

## 2021-01-25 LAB — NOVEL CORONAVIRUS, NAA: SARS-CoV-2, NAA: NOT DETECTED

## 2021-01-25 LAB — SARS-COV-2, NAA 2 DAY TAT

## 2021-04-02 ENCOUNTER — Other Ambulatory Visit (HOSPITAL_COMMUNITY): Payer: Self-pay

## 2021-04-02 MED ORDER — BEPOTASTINE BESILATE 1.5 % OP SOLN
1.0000 [drp] | Freq: Two times a day (BID) | OPHTHALMIC | 3 refills | Status: DC
  Filled 2021-04-02: qty 5, 50d supply, fill #0
  Filled 2021-04-02: qty 5, 38d supply, fill #0
  Filled 2021-04-04: qty 5, 50d supply, fill #0

## 2021-04-02 MED ORDER — MOMETASONE FUROATE 50 MCG/ACT NA SUSP
1.0000 | Freq: Two times a day (BID) | NASAL | 3 refills | Status: DC
  Filled 2021-04-02: qty 51, 90d supply, fill #0
  Filled 2021-04-02 – 2021-05-14 (×3): qty 17, 30d supply, fill #0
  Filled 2021-05-14: qty 51, 90d supply, fill #0

## 2021-04-02 MED FILL — Hydrochlorothiazide Tab 25 MG: ORAL | 90 days supply | Qty: 90 | Fill #0 | Status: AC

## 2021-04-02 MED FILL — Norethindrone Ace-Ethinyl Estradiol-FE Tab 1 MG-20 MCG (24): ORAL | 84 days supply | Qty: 84 | Fill #0 | Status: AC

## 2021-04-02 MED FILL — Montelukast Sodium Tab 10 MG (Base Equiv): ORAL | 90 days supply | Qty: 90 | Fill #0 | Status: AC

## 2021-04-03 ENCOUNTER — Other Ambulatory Visit (HOSPITAL_COMMUNITY): Payer: Self-pay

## 2021-04-04 ENCOUNTER — Other Ambulatory Visit (HOSPITAL_COMMUNITY): Payer: Self-pay

## 2021-04-25 ENCOUNTER — Other Ambulatory Visit (HOSPITAL_COMMUNITY): Payer: Self-pay

## 2021-04-25 MED FILL — Meloxicam Tab 7.5 MG: ORAL | 30 days supply | Qty: 60 | Fill #0 | Status: CN

## 2021-05-07 DIAGNOSIS — E7800 Pure hypercholesterolemia, unspecified: Secondary | ICD-10-CM | POA: Diagnosis not present

## 2021-05-14 ENCOUNTER — Other Ambulatory Visit (HOSPITAL_COMMUNITY): Payer: Self-pay

## 2021-05-14 DIAGNOSIS — J302 Other seasonal allergic rhinitis: Secondary | ICD-10-CM | POA: Diagnosis not present

## 2021-05-14 DIAGNOSIS — I10 Essential (primary) hypertension: Secondary | ICD-10-CM | POA: Diagnosis not present

## 2021-05-14 DIAGNOSIS — F325 Major depressive disorder, single episode, in full remission: Secondary | ICD-10-CM | POA: Diagnosis not present

## 2021-05-14 DIAGNOSIS — Z1389 Encounter for screening for other disorder: Secondary | ICD-10-CM | POA: Diagnosis not present

## 2021-05-14 DIAGNOSIS — M5431 Sciatica, right side: Secondary | ICD-10-CM | POA: Diagnosis not present

## 2021-05-14 DIAGNOSIS — Z1331 Encounter for screening for depression: Secondary | ICD-10-CM | POA: Diagnosis not present

## 2021-05-14 DIAGNOSIS — Z0000 Encounter for general adult medical examination without abnormal findings: Secondary | ICD-10-CM | POA: Diagnosis not present

## 2021-05-14 DIAGNOSIS — H60311 Diffuse otitis externa, right ear: Secondary | ICD-10-CM | POA: Diagnosis not present

## 2021-05-14 DIAGNOSIS — G5601 Carpal tunnel syndrome, right upper limb: Secondary | ICD-10-CM | POA: Diagnosis not present

## 2021-05-14 DIAGNOSIS — R82998 Other abnormal findings in urine: Secondary | ICD-10-CM | POA: Diagnosis not present

## 2021-05-14 DIAGNOSIS — E7800 Pure hypercholesterolemia, unspecified: Secondary | ICD-10-CM | POA: Diagnosis not present

## 2021-05-14 DIAGNOSIS — E669 Obesity, unspecified: Secondary | ICD-10-CM | POA: Diagnosis not present

## 2021-05-14 MED ORDER — CORTISPORIN-TC 3.3-3-10-0.5 MG/ML OT SUSP
5.0000 [drp] | Freq: Three times a day (TID) | OTIC | 0 refills | Status: DC
  Filled 2021-05-14: qty 10, 13d supply, fill #0

## 2021-05-14 MED ORDER — HYDROCHLOROTHIAZIDE 25 MG PO TABS
25.0000 mg | ORAL_TABLET | Freq: Every day | ORAL | 3 refills | Status: DC
  Filled 2021-05-14 – 2021-12-10 (×2): qty 90, 90d supply, fill #0
  Filled 2022-03-24: qty 90, 90d supply, fill #1

## 2021-05-15 ENCOUNTER — Other Ambulatory Visit (HOSPITAL_COMMUNITY): Payer: Self-pay | Admitting: Internal Medicine

## 2021-05-15 ENCOUNTER — Other Ambulatory Visit (HOSPITAL_COMMUNITY): Payer: Self-pay

## 2021-05-15 MED ORDER — MOMETASONE FUROATE 50 MCG/ACT NA SUSP
1.0000 | Freq: Two times a day (BID) | NASAL | 3 refills | Status: DC | PRN
  Filled 2021-05-15: qty 51, 90d supply, fill #0

## 2021-05-16 ENCOUNTER — Other Ambulatory Visit (HOSPITAL_COMMUNITY): Payer: Self-pay

## 2021-05-17 ENCOUNTER — Other Ambulatory Visit (HOSPITAL_COMMUNITY): Payer: Self-pay

## 2021-05-18 ENCOUNTER — Other Ambulatory Visit (HOSPITAL_COMMUNITY): Payer: Self-pay

## 2021-05-22 ENCOUNTER — Other Ambulatory Visit (HOSPITAL_COMMUNITY): Payer: Self-pay

## 2021-05-23 ENCOUNTER — Other Ambulatory Visit (HOSPITAL_COMMUNITY): Payer: Self-pay

## 2021-05-24 ENCOUNTER — Other Ambulatory Visit (HOSPITAL_COMMUNITY): Payer: Self-pay

## 2021-05-24 MED ORDER — FLUTICASONE PROPIONATE 50 MCG/ACT NA SUSP
NASAL | 3 refills | Status: DC
  Filled 2021-05-24: qty 16, 30d supply, fill #0

## 2021-05-30 ENCOUNTER — Other Ambulatory Visit (HOSPITAL_COMMUNITY): Payer: Self-pay

## 2021-05-30 MED ORDER — FLUCONAZOLE 150 MG PO TABS
150.0000 mg | ORAL_TABLET | ORAL | 0 refills | Status: DC
  Filled 2021-05-30: qty 2, 6d supply, fill #0

## 2021-05-30 MED ORDER — AMOXICILLIN-POT CLAVULANATE 875-125 MG PO TABS
1.0000 | ORAL_TABLET | Freq: Two times a day (BID) | ORAL | 0 refills | Status: AC
  Filled 2021-05-30: qty 14, 7d supply, fill #0

## 2021-06-07 DIAGNOSIS — H73891 Other specified disorders of tympanic membrane, right ear: Secondary | ICD-10-CM | POA: Diagnosis not present

## 2021-06-07 DIAGNOSIS — H93291 Other abnormal auditory perceptions, right ear: Secondary | ICD-10-CM | POA: Diagnosis not present

## 2021-06-07 DIAGNOSIS — H9201 Otalgia, right ear: Secondary | ICD-10-CM | POA: Diagnosis not present

## 2021-06-14 DIAGNOSIS — E669 Obesity, unspecified: Secondary | ICD-10-CM | POA: Diagnosis not present

## 2021-06-14 DIAGNOSIS — Z1211 Encounter for screening for malignant neoplasm of colon: Secondary | ICD-10-CM | POA: Diagnosis not present

## 2021-06-15 ENCOUNTER — Other Ambulatory Visit (HOSPITAL_COMMUNITY): Payer: Self-pay

## 2021-06-15 MED ORDER — CLENPIQ 10-3.5-12 MG-GM -GM/160ML PO SOLN
ORAL | 0 refills | Status: DC
  Filled 2021-06-15: qty 320, 1d supply, fill #0

## 2021-06-26 MED FILL — Norethindrone Ace-Ethinyl Estradiol-FE Tab 1 MG-20 MCG (24): ORAL | 84 days supply | Qty: 84 | Fill #1 | Status: AC

## 2021-06-27 ENCOUNTER — Other Ambulatory Visit (HOSPITAL_COMMUNITY): Payer: Self-pay

## 2021-07-04 ENCOUNTER — Encounter: Payer: Self-pay | Admitting: Obstetrics & Gynecology

## 2021-07-04 DIAGNOSIS — Z1231 Encounter for screening mammogram for malignant neoplasm of breast: Secondary | ICD-10-CM | POA: Diagnosis not present

## 2021-07-11 DIAGNOSIS — Z1211 Encounter for screening for malignant neoplasm of colon: Secondary | ICD-10-CM | POA: Diagnosis not present

## 2021-07-15 ENCOUNTER — Other Ambulatory Visit (HOSPITAL_COMMUNITY): Payer: Self-pay

## 2021-07-19 ENCOUNTER — Other Ambulatory Visit (HOSPITAL_COMMUNITY): Payer: Self-pay

## 2021-07-20 ENCOUNTER — Other Ambulatory Visit (HOSPITAL_COMMUNITY): Payer: Self-pay

## 2021-07-24 ENCOUNTER — Other Ambulatory Visit (HOSPITAL_COMMUNITY): Payer: Self-pay

## 2021-07-24 DIAGNOSIS — M25462 Effusion, left knee: Secondary | ICD-10-CM | POA: Diagnosis not present

## 2021-07-24 DIAGNOSIS — M25562 Pain in left knee: Secondary | ICD-10-CM | POA: Diagnosis not present

## 2021-07-24 MED ORDER — MONTELUKAST SODIUM 10 MG PO TABS
10.0000 mg | ORAL_TABLET | Freq: Every day | ORAL | 3 refills | Status: DC
  Filled 2021-07-24: qty 30, 30d supply, fill #0
  Filled 2021-08-02: qty 90, 90d supply, fill #0

## 2021-07-24 MED ORDER — DICLOFENAC SODIUM 75 MG PO TBEC
75.0000 mg | DELAYED_RELEASE_TABLET | Freq: Two times a day (BID) | ORAL | 2 refills | Status: DC
  Filled 2021-07-24: qty 60, 30d supply, fill #0

## 2021-07-31 DIAGNOSIS — S83282D Other tear of lateral meniscus, current injury, left knee, subsequent encounter: Secondary | ICD-10-CM | POA: Diagnosis not present

## 2021-07-31 DIAGNOSIS — M25562 Pain in left knee: Secondary | ICD-10-CM | POA: Diagnosis not present

## 2021-08-01 ENCOUNTER — Other Ambulatory Visit (HOSPITAL_COMMUNITY): Payer: Self-pay

## 2021-08-01 DIAGNOSIS — M25562 Pain in left knee: Secondary | ICD-10-CM | POA: Diagnosis not present

## 2021-08-02 ENCOUNTER — Other Ambulatory Visit (HOSPITAL_COMMUNITY): Payer: Self-pay

## 2021-08-03 DIAGNOSIS — M25562 Pain in left knee: Secondary | ICD-10-CM | POA: Diagnosis not present

## 2021-08-06 DIAGNOSIS — M25562 Pain in left knee: Secondary | ICD-10-CM | POA: Diagnosis not present

## 2021-08-09 DIAGNOSIS — M25562 Pain in left knee: Secondary | ICD-10-CM | POA: Diagnosis not present

## 2021-08-14 DIAGNOSIS — M25562 Pain in left knee: Secondary | ICD-10-CM | POA: Diagnosis not present

## 2021-08-17 DIAGNOSIS — M25562 Pain in left knee: Secondary | ICD-10-CM | POA: Diagnosis not present

## 2021-08-20 MED FILL — Hydrochlorothiazide Tab 25 MG: ORAL | 90 days supply | Qty: 90 | Fill #1 | Status: AC

## 2021-08-21 ENCOUNTER — Other Ambulatory Visit (HOSPITAL_COMMUNITY): Payer: Self-pay

## 2021-08-22 DIAGNOSIS — M25562 Pain in left knee: Secondary | ICD-10-CM | POA: Diagnosis not present

## 2021-08-23 DIAGNOSIS — M25562 Pain in left knee: Secondary | ICD-10-CM | POA: Diagnosis not present

## 2021-08-24 DIAGNOSIS — M25562 Pain in left knee: Secondary | ICD-10-CM | POA: Diagnosis not present

## 2021-08-27 DIAGNOSIS — M25562 Pain in left knee: Secondary | ICD-10-CM | POA: Diagnosis not present

## 2021-08-30 DIAGNOSIS — M25562 Pain in left knee: Secondary | ICD-10-CM | POA: Diagnosis not present

## 2021-09-05 DIAGNOSIS — M25562 Pain in left knee: Secondary | ICD-10-CM | POA: Diagnosis not present

## 2021-09-10 DIAGNOSIS — M25562 Pain in left knee: Secondary | ICD-10-CM | POA: Diagnosis not present

## 2021-09-13 DIAGNOSIS — M25562 Pain in left knee: Secondary | ICD-10-CM | POA: Diagnosis not present

## 2021-09-18 DIAGNOSIS — M25562 Pain in left knee: Secondary | ICD-10-CM | POA: Diagnosis not present

## 2021-09-20 DIAGNOSIS — M25562 Pain in left knee: Secondary | ICD-10-CM | POA: Diagnosis not present

## 2021-09-20 MED FILL — Norethindrone Ace-Ethinyl Estradiol-FE Tab 1 MG-20 MCG (24): ORAL | 84 days supply | Qty: 84 | Fill #2 | Status: AC

## 2021-09-21 ENCOUNTER — Other Ambulatory Visit (HOSPITAL_COMMUNITY): Payer: Self-pay

## 2021-09-24 DIAGNOSIS — M25562 Pain in left knee: Secondary | ICD-10-CM | POA: Diagnosis not present

## 2021-09-26 DIAGNOSIS — M25562 Pain in left knee: Secondary | ICD-10-CM | POA: Diagnosis not present

## 2021-09-27 DIAGNOSIS — M25562 Pain in left knee: Secondary | ICD-10-CM | POA: Diagnosis not present

## 2021-10-16 ENCOUNTER — Other Ambulatory Visit (HOSPITAL_COMMUNITY): Payer: Self-pay | Admitting: Internal Medicine

## 2021-10-16 DIAGNOSIS — J302 Other seasonal allergic rhinitis: Secondary | ICD-10-CM | POA: Diagnosis not present

## 2021-10-16 DIAGNOSIS — R519 Headache, unspecified: Secondary | ICD-10-CM | POA: Diagnosis not present

## 2021-10-16 DIAGNOSIS — G8929 Other chronic pain: Secondary | ICD-10-CM

## 2021-10-16 DIAGNOSIS — I10 Essential (primary) hypertension: Secondary | ICD-10-CM | POA: Diagnosis not present

## 2021-10-16 DIAGNOSIS — R200 Anesthesia of skin: Secondary | ICD-10-CM

## 2021-10-16 DIAGNOSIS — H9201 Otalgia, right ear: Secondary | ICD-10-CM

## 2021-10-16 DIAGNOSIS — Z1152 Encounter for screening for COVID-19: Secondary | ICD-10-CM | POA: Diagnosis not present

## 2021-10-17 ENCOUNTER — Ambulatory Visit (HOSPITAL_COMMUNITY)
Admission: RE | Admit: 2021-10-17 | Discharge: 2021-10-17 | Disposition: A | Discharge status: Home / Self Care | Payer: 59 | Source: Ambulatory Visit | Attending: Internal Medicine | Admitting: Internal Medicine

## 2021-10-17 DIAGNOSIS — R519 Headache, unspecified: Secondary | ICD-10-CM | POA: Diagnosis not present

## 2021-10-17 DIAGNOSIS — R200 Anesthesia of skin: Secondary | ICD-10-CM | POA: Insufficient documentation

## 2021-10-17 DIAGNOSIS — H9201 Otalgia, right ear: Secondary | ICD-10-CM | POA: Insufficient documentation

## 2021-10-17 DIAGNOSIS — G8929 Other chronic pain: Secondary | ICD-10-CM | POA: Diagnosis not present

## 2021-10-17 MED ORDER — GADOBUTROL 1 MMOL/ML IV SOLN
10.0000 mL | Freq: Once | INTRAVENOUS | Status: AC | PRN
  Administered 2021-10-17: 10 mL via INTRAVENOUS

## 2021-11-21 ENCOUNTER — Other Ambulatory Visit (HOSPITAL_COMMUNITY): Payer: Self-pay

## 2021-11-21 DIAGNOSIS — H6121 Impacted cerumen, right ear: Secondary | ICD-10-CM | POA: Diagnosis not present

## 2021-11-21 DIAGNOSIS — H9201 Otalgia, right ear: Secondary | ICD-10-CM | POA: Diagnosis not present

## 2021-11-21 MED ORDER — METHOCARBAMOL 750 MG PO TABS
750.0000 mg | ORAL_TABLET | Freq: Every evening | ORAL | 1 refills | Status: DC | PRN
  Filled 2021-11-21: qty 20, 20d supply, fill #0

## 2021-11-29 ENCOUNTER — Other Ambulatory Visit (HOSPITAL_COMMUNITY): Payer: Self-pay

## 2021-12-07 ENCOUNTER — Other Ambulatory Visit (HOSPITAL_COMMUNITY): Payer: Self-pay

## 2021-12-10 ENCOUNTER — Other Ambulatory Visit (HOSPITAL_COMMUNITY): Payer: Self-pay

## 2021-12-10 MED FILL — Norethindrone Ace-Ethinyl Estradiol-FE Tab 1 MG-20 MCG (24): ORAL | 84 days supply | Qty: 84 | Fill #3 | Status: AC

## 2021-12-26 ENCOUNTER — Other Ambulatory Visit (HOSPITAL_COMMUNITY): Payer: Self-pay

## 2021-12-26 MED ORDER — AMOXICILLIN 500 MG PO CAPS
500.0000 mg | ORAL_CAPSULE | Freq: Three times a day (TID) | ORAL | 0 refills | Status: DC
  Filled 2021-12-26: qty 21, 7d supply, fill #0

## 2022-01-31 ENCOUNTER — Emergency Department (HOSPITAL_BASED_OUTPATIENT_CLINIC_OR_DEPARTMENT_OTHER): Payer: 59 | Admitting: Radiology

## 2022-01-31 ENCOUNTER — Emergency Department (HOSPITAL_BASED_OUTPATIENT_CLINIC_OR_DEPARTMENT_OTHER)
Admission: EM | Admit: 2022-01-31 | Discharge: 2022-01-31 | Disposition: A | Discharge status: Home / Self Care | Payer: 59 | Attending: Emergency Medicine | Admitting: Emergency Medicine

## 2022-01-31 ENCOUNTER — Encounter (HOSPITAL_BASED_OUTPATIENT_CLINIC_OR_DEPARTMENT_OTHER): Payer: Self-pay | Admitting: Emergency Medicine

## 2022-01-31 ENCOUNTER — Other Ambulatory Visit: Payer: Self-pay

## 2022-01-31 ENCOUNTER — Other Ambulatory Visit (HOSPITAL_COMMUNITY): Payer: Self-pay

## 2022-01-31 DIAGNOSIS — M7918 Myalgia, other site: Secondary | ICD-10-CM

## 2022-01-31 DIAGNOSIS — R61 Generalized hyperhidrosis: Secondary | ICD-10-CM | POA: Diagnosis not present

## 2022-01-31 DIAGNOSIS — Z79899 Other long term (current) drug therapy: Secondary | ICD-10-CM | POA: Insufficient documentation

## 2022-01-31 DIAGNOSIS — R079 Chest pain, unspecified: Secondary | ICD-10-CM | POA: Diagnosis not present

## 2022-01-31 DIAGNOSIS — M791 Myalgia, unspecified site: Secondary | ICD-10-CM | POA: Insufficient documentation

## 2022-01-31 DIAGNOSIS — I10 Essential (primary) hypertension: Secondary | ICD-10-CM | POA: Diagnosis not present

## 2022-01-31 DIAGNOSIS — R112 Nausea with vomiting, unspecified: Secondary | ICD-10-CM | POA: Diagnosis not present

## 2022-01-31 DIAGNOSIS — R42 Dizziness and giddiness: Secondary | ICD-10-CM | POA: Diagnosis not present

## 2022-01-31 DIAGNOSIS — R0789 Other chest pain: Secondary | ICD-10-CM | POA: Diagnosis not present

## 2022-01-31 LAB — BASIC METABOLIC PANEL
Anion gap: 11 (ref 5–15)
BUN: 10 mg/dL (ref 6–20)
CO2: 24 mmol/L (ref 22–32)
Calcium: 9.7 mg/dL (ref 8.9–10.3)
Chloride: 100 mmol/L (ref 98–111)
Creatinine, Ser: 0.76 mg/dL (ref 0.44–1.00)
GFR, Estimated: >60 mL/min (ref >60)
Glucose, Bld: 152 mg/dL — ABNORMAL HIGH (ref 70–99)
Potassium: 4 mmol/L (ref 3.5–5.1)
Sodium: 135 mmol/L (ref 135–145)

## 2022-01-31 LAB — PREGNANCY, URINE: Preg Test, Ur: NEGATIVE

## 2022-01-31 LAB — CBC
HCT: 42.3 % (ref 36.0–46.0)
Hemoglobin: 14.3 g/dL (ref 12.0–15.0)
MCH: 28.1 pg (ref 26.0–34.0)
MCHC: 33.8 g/dL (ref 30.0–36.0)
MCV: 83.3 fL (ref 80.0–100.0)
Platelets: 277 10*3/uL (ref 150–400)
RBC: 5.08 MIL/uL (ref 3.87–5.11)
RDW: 13.1 % (ref 11.5–15.5)
WBC: 9.4 10*3/uL (ref 4.0–10.5)
nRBC: 0 % (ref 0.0–0.2)

## 2022-01-31 LAB — TROPONIN I (HIGH SENSITIVITY)
Troponin I (High Sensitivity): 3 ng/L (ref <18)
Troponin I (High Sensitivity): 3 ng/L (ref <18)

## 2022-01-31 MED ORDER — LIDOCAINE VISCOUS HCL 2 % MT SOLN
15.0000 mL | Freq: Once | OROMUCOSAL | Status: AC
Start: 2022-01-31 — End: 2022-01-31
  Administered 2022-01-31: 15 mL via ORAL
  Filled 2022-01-31: qty 15

## 2022-01-31 MED ORDER — IBUPROFEN 400 MG PO TABS
600.0000 mg | ORAL_TABLET | Freq: Once | ORAL | Status: AC
  Administered 2022-01-31: 600 mg via ORAL
  Filled 2022-01-31: qty 1

## 2022-01-31 MED ORDER — IBUPROFEN 600 MG PO TABS
600.0000 mg | ORAL_TABLET | Freq: Four times a day (QID) | ORAL | 0 refills | Status: AC | PRN
  Filled 2022-01-31: qty 30, 8d supply, fill #0

## 2022-01-31 MED ORDER — ALUM & MAG HYDROXIDE-SIMETH 200-200-20 MG/5ML PO SUSP
30.0000 mL | Freq: Once | ORAL | Status: AC
  Administered 2022-01-31: 30 mL via ORAL
  Filled 2022-01-31: qty 30

## 2022-01-31 NOTE — ED Triage Notes (Signed)
Pt presents with dull,constant chest pressure since last night. Pt has high blood pressure,no other risk factors.

## 2022-01-31 NOTE — ED Provider Notes (Signed)
[facility] Provider Note   CSN: [id number] Arrival date & time: 01/31/22  5462     History  Chief Complaint  Patient presents with   Chest Pain    [name] is a 46 y.o. female.  Patient is a 46 yo female with pmh of hypertension presenting for chest pain. Pt states chest pain started last night while driving, left chest border and under left breast, constant, non radiating, with associated nausea with one episode of emesis, and diaphoresis. Pain was enough to prevent her from sleeping. Pt does have hx of helping patient ambulate to the bathroom yesterday at work.  No DM, previous MI, tobacco use, hyperlipidemia, or family hx of MI.   The history is provided by the patient. No language interpreter was used.  Chest Pain Associated symptoms: no abdominal pain, no back pain, no cough, no fever, no palpitations, no shortness of breath and no vomiting       Home Medications Prior to Admission medications   Medication Sig Start Date End Date Taking? Authorizing Provider  hydrochlorothiazide (HYDRODIURIL) 25 MG tablet Take 1 tablet (25 mg total) by mouth daily. 05/14/21  Yes   ibuprofen (ADVIL) 600 MG tablet Take 1 tablet (600 mg total) by mouth every 6 (six) hours as needed for mild pain. 7/0/35  Yes Campbell Stall P, DO  amoxicillin (AMOXIL) 500 MG capsule Take 1 capsule (500 mg total) by mouth 3 (three) times daily. 12/26/21   Belcher, Bennie Pierini., DDS  Bepotastine Besilate 1.5 % SOLN Place 1 drop into both eyes 2 (two) times daily. Patient not taking: Reported on 01/31/2022 05/09/20     diclofenac (VOLTAREN) 75 MG EC tablet Take 1 tablet (75 mg total) by mouth 2 (two) times daily. 07/24/21     fluconazole (DIFLUCAN) 150 MG tablet Take 1 tablet (150 mg total) by mouth every 3 (three) days for 2 doses 05/30/21     fluticasone (FLONASE) 50 MCG/ACT nasal spray Use 1 spray in each nostril 2 times daily Patient not taking: Reported on 01/31/2022 05/24/21      meloxicam (MOBIC) 7.5 MG tablet Take 7.5 mg by mouth 2 (two) times daily. Patient not taking: Reported on 01/31/2022    [provider]  methocarbamol (ROBAXIN) 750 MG tablet Take 1 tablet (750 mg total) by mouth at bedtime as needed for ear pain Patient not taking: Reported on 01/31/2022 11/21/21     mometasone (NASONEX) 50 MCG/ACT nasal spray Place 1 spray into the nose 2 (two) times daily. Patient not taking: Reported on 01/31/2022 04/02/21     mometasone (NASONEX) 50 MCG/ACT nasal spray Place 1 spray into each nostril 2 (two) times daily as needed. Patient not taking: Reported on 01/31/2022 05/14/21   Tisovec, Fransico Him, MD  montelukast (SINGULAIR) 10 MG tablet TAKE 1 TABLET BY MOUTH ONCE DAILY. 05/08/20 07/01/21  Tisovec, Fransico Him, MD  montelukast (SINGULAIR) 10 MG tablet Take 1 tablet (10 mg total) by mouth daily. Patient not taking: Reported on 01/31/2022 07/24/21     neomycin-colistin-hydrocortisone-thonzonium (CORTISPORIN-TC) 3.03-01-09-0.5 MG/ML OTIC suspension Use 5 drops into the affected ear 3 times daily for 10 days Patient not taking: Reported on 01/31/2022 05/14/21     Norethindrone Acetate-Ethinyl Estrad-FE (LOESTRIN 24 FE) 1-20 MG-MCG(24) tablet TAKE 1 TABLET BY MOUTH DAILY. Patient not taking: Reported on 01/31/2022 01/12/21 03/08/22  Princess Bruins, MD  Sod Picosulfate-Mag Ox-Cit [facility]) 10-3.5-12 MG-GM -GM/160ML SOLN Use as directed Patient not taking: Reported on 01/31/2022 06/15/21  Allergies    Patient has no known allergies.    Review of Systems   Review of Systems  Constitutional:  Negative for chills and fever.  HENT:  Negative for ear pain and sore throat.   Eyes:  Negative for pain and visual disturbance.  Respiratory:  Negative for cough and shortness of breath.   Cardiovascular:  Positive for chest pain. Negative for palpitations.  Gastrointestinal:  Negative for abdominal pain and vomiting.  Genitourinary:  Negative for dysuria and hematuria.   Musculoskeletal:  Negative for arthralgias and back pain.  Skin:  Negative for color change and rash.  Neurological:  Negative for seizures and syncope.  All other systems reviewed and are negative.  Physical Exam Updated Vital Signs BP (!) 143/89    Pulse 81    Temp 98.4 F (36.9 C) (Oral)    Resp 20    SpO2 100%  Physical Exam Vitals and nursing note reviewed.  Constitutional:      General: She is not in acute distress.    Appearance: She is well-developed.  HENT:     Head: Normocephalic and atraumatic.  Eyes:     Conjunctiva/sclera: Conjunctivae normal.  Cardiovascular:     Rate and Rhythm: Normal rate and regular rhythm.     Heart sounds: No murmur heard. Pulmonary:     Effort: Pulmonary effort is normal. No respiratory distress.     Breath sounds: Normal breath sounds.  Chest:     Chest wall: Tenderness present. No crepitus.    Abdominal:     Palpations: Abdomen is soft.     Tenderness: There is no abdominal tenderness.  Musculoskeletal:        General: No swelling.     Cervical back: Neck supple.  Skin:    General: Skin is warm and dry.     Capillary Refill: Capillary refill takes less than 2 seconds.  Neurological:     Mental Status: She is alert.  Psychiatric:        Mood and Affect: Mood normal.    ED Results / Procedures / Treatments   Labs (all labs ordered are listed, but only abnormal results are displayed) Labs Reviewed  BASIC METABOLIC PANEL - Abnormal; Notable for the following components:      Result Value   Glucose, Bld 152 (*)    All other components within normal limits  CBC  PREGNANCY, URINE  TROPONIN I (HIGH SENSITIVITY)  TROPONIN I (HIGH SENSITIVITY)    EKG EKG Interpretation  Date/Time:  Thursday January 31 2022 08:30:43 EST Ventricular Rate:  99 PR Interval:  150 QRS Duration: 76 QT Interval:  344 QTC Calculation: 442 R Axis:   63 Text Interpretation: Sinus rhythm Borderline repolarization abnormality Confirmed by Campbell Stall ([id number]) on 12/31/4578 9:50:46 AM  Radiology DG Chest 2 View  Result Date: 01/31/2022 CLINICAL DATA:  Left-sided chest pain and dizziness. EXAM: CHEST - 2 VIEW COMPARISON:  None. FINDINGS: The heart size and mediastinal contours are within normal limits. Both lungs are clear. The visualized skeletal structures are unremarkable. IMPRESSION: No active cardiopulmonary disease. Electronically Signed   By: Titus Dubin M.D.   On: 01/31/2022 09:01    Procedures Procedures    Medications Ordered in ED Medications  ibuprofen (ADVIL) tablet 600 mg (600 mg Oral Given 01/31/22 0912)  alum & mag hydroxide-simeth (MAALOX/MYLANTA) 200-200-20 MG/5ML suspension 30 mL (30 mLs Oral Given 01/31/22 1133)    And  lidocaine (XYLOCAINE) 2 % viscous mouth solution 15 mL (  15 mLs Oral Given 01/31/22 1133)    ED Course/ Medical Decision Making/ A&P                           Medical Decision Making Amount and/or Complexity of Data Reviewed Labs: ordered. Radiology: ordered.  Risk OTC drugs. Prescription drug management.   12:12 PM 46 yo female with pmh of hypertension presenting for chest pain. Pain is likely musculoskeletal in nature due to it's reproducibility on exam but will complete ACS workup to be safe.   The patient's chest pain is not suggestive of pulmonary embolus, cardiac ischemia, aortic dissection, pericarditis, myocarditis, pulmonary embolism, pneumothorax, pneumonia, Zoster, or esophageal perforation, or other serious etiology.  Historically not abrupt in onset, tearing or ripping, pulses symmetric. EKG nonspecific for ischemia/infarction. No dysrhythmias, brugada, WPW, prolonged QT noted. [CXR reviewed and WNL] [Troponin negative x2. CXR reviewed. Labs without demonstration of acute pathology unless otherwise noted above. Low HEART Score: 0-3 points (0.9-1.7% risk of MACE).] Given the extremely low risk of these diagnoses further testing and evaluation for these possibilities does not appear to  be indicated at this time. Patient in no distress and overall condition improved here in the ED. Detailed discussions were had with the patient regarding current findings, and need for close f/u with PCP or on call doctor. The patient has been instructed to return immediately if the symptoms worsen in any way for re-evaluation. Patient verbalized understanding and is in agreement with current care plan. All questions answered prior to discharge.         Final Clinical Impression(s) / ED Diagnoses Final diagnoses:  Chest pain, unspecified type  Musculoskeletal pain    Rx / DC Orders ED Discharge Orders          Ordered    ibuprofen (ADVIL) 600 MG tablet  Every 6 hours PRN        01/31/22 1212              Lianne Cure, DO 89/21/19 1213

## 2022-01-31 NOTE — ED Notes (Signed)
Patient transported to X-ray 

## 2022-02-20 ENCOUNTER — Other Ambulatory Visit (HOSPITAL_COMMUNITY): Payer: Self-pay

## 2022-02-20 ENCOUNTER — Ambulatory Visit (INDEPENDENT_AMBULATORY_CARE_PROVIDER_SITE_OTHER): Payer: 59 | Admitting: Obstetrics & Gynecology

## 2022-02-20 ENCOUNTER — Encounter: Payer: Self-pay | Admitting: Obstetrics & Gynecology

## 2022-02-20 ENCOUNTER — Other Ambulatory Visit: Payer: Self-pay

## 2022-02-20 VITALS — BP 120/76 | HR 100 | Resp 16 | Ht 66.25 in | Wt 212.0 lb

## 2022-02-20 DIAGNOSIS — E6609 Other obesity due to excess calories: Secondary | ICD-10-CM | POA: Diagnosis not present

## 2022-02-20 DIAGNOSIS — Z3041 Encounter for surveillance of contraceptive pills: Secondary | ICD-10-CM | POA: Diagnosis not present

## 2022-02-20 DIAGNOSIS — Z01419 Encounter for gynecological examination (general) (routine) without abnormal findings: Secondary | ICD-10-CM

## 2022-02-20 DIAGNOSIS — Z6833 Body mass index (BMI) 33.0-33.9, adult: Secondary | ICD-10-CM | POA: Diagnosis not present

## 2022-02-20 MED ORDER — NORETHIN ACE-ETH ESTRAD-FE 1-20 MG-MCG(24) PO TABS
1.0000 | ORAL_TABLET | Freq: Every day | ORAL | 4 refills | Status: DC
  Filled 2022-02-20 – 2022-03-07 (×2): qty 84, 84d supply, fill #0
  Filled 2022-05-19: qty 84, 84d supply, fill #1
  Filled 2022-08-19: qty 84, 84d supply, fill #2
  Filled 2022-11-10: qty 84, 84d supply, fill #3
  Filled 2023-02-02: qty 84, 84d supply, fill #4

## 2022-02-20 NOTE — Progress Notes (Signed)
[name] August 21, 1976 [id number]   History:    46 y.o.  [id number] Married. Graduated as NP.  Son is 27 yo.     RP:  Established patient presenting for annual gyn exam    HPI: Well on LoEstrin 24 Fe 1/20 generic.  Lighter menstrual flow.  No BTB.  No pelvic pain.  No pain with IC. Pap Neg 12/2020.  Urine/BMs normal.  Breasts normal. Mammo Neg 06/2021. BMI increased to 33.96.  On a low Calorie/Carb diet with [employer].  Walking.  Health Labs with Dr [name].  COLONOSCOPY: 07-11-21  Past medical history,surgical history, family history and social history were all reviewed and documented in the EPIC chart.  Gynecologic History Patient's last menstrual period was 02/06/2022 (exact date).  Obstetric History OB History  Gravida Para Term Preterm AB Living  4 1     3 1   SAB IAB Ectopic Multiple Live Births  3            # Outcome Date GA Lbr Len/2nd Weight Sex Delivery Anes PTL Lv  4 SAB           3 SAB           2 SAB           1 Para              ROS: A ROS was performed and pertinent positives and negatives are included in the history.  GENERAL: No fevers or chills. HEENT: No change in vision, no earache, sore throat or sinus congestion. NECK: No pain or stiffness. CARDIOVASCULAR: No chest pain or pressure. No palpitations. PULMONARY: No shortness of breath, cough or wheeze. GASTROINTESTINAL: No abdominal pain, nausea, vomiting or diarrhea, melena or bright red blood per rectum. GENITOURINARY: No urinary frequency, urgency, hesitancy or dysuria. MUSCULOSKELETAL: No joint or muscle pain, no back pain, no recent trauma. DERMATOLOGIC: No rash, no itching, no lesions. ENDOCRINE: No polyuria, polydipsia, no heat or cold intolerance. No recent change in weight. HEMATOLOGICAL: No anemia or easy bruising or bleeding. NEUROLOGIC: No headache, seizures, numbness, tingling or weakness. PSYCHIATRIC: No depression, no loss of interest in normal activity or change in sleep pattern.      Exam:   BP 120/76    Pulse 100    Resp 16    Ht 5' 6.25" (1.683 m)    Wt 212 lb (96.2 kg)    LMP 02/06/2022 (Exact Date)    BMI 33.96 kg/m   Body mass index is 33.96 kg/m.  General appearance : Well developed well nourished female. No acute distress HEENT: Eyes: no retinal hemorrhage or exudates,  Neck supple, trachea midline, no carotid bruits, no thyroidmegaly Lungs: Clear to auscultation, no rhonchi or wheezes, or rib retractions  Heart: Regular rate and rhythm, no murmurs or gallops Breast:Examined in sitting and supine position were symmetrical in appearance, no palpable masses or tenderness,  no skin retraction, no nipple inversion, no nipple discharge, no skin discoloration, no axillary or supraclavicular lymphadenopathy Abdomen: no palpable masses or tenderness, no rebound or guarding Extremities: no edema or skin discoloration or tenderness  Pelvic: Vulva: Normal             Vagina: No gross lesions or discharge  Cervix: No gross lesions or discharge  Uterus  AV, normal size, shape and consistency, non-tender and mobile  Adnexa  Without masses or tenderness  Anus: Normal   Assessment/Plan:  46 y.o. female for annual exam   1. Well  female exam with routine gynecological exam Well on LoEstrin 24 Fe 1/20 generic.  Lighter menstrual flow.  No BTB.  No pelvic pain.  No pain with IC. Pap Neg 12/2020.  Urine/BMs normal.  Breasts normal. Mammo Neg 06/2021. BMI increased to 33.96.  On a low Calorie/Carb diet with [employer].  Walking.  Health Labs with Dr [name].  COLONOSCOPY: 07-11-21  2. Encounter for surveillance of contraceptive pills Well on LoEstrin 24 Fe 1/20 generic.  Lighter menstrual flow.  No BTB.  No pelvic pain.   3. Class 1 obesity due to excess calories without serious comorbidity with body mass index (BMI) of 33.0 to 33.9 in adult  On a low Calorie/Carb diet with [employer].  Walking.    Other orders - Norethindrone Acetate-Ethinyl Estrad-FE (LOESTRIN 24 FE) 1-20 MG-MCG(24)  tablet; TAKE 1 TABLET BY MOUTH DAILY.   Ann Bruins MD, 3:11 PM 02/20/2022

## 2022-02-28 ENCOUNTER — Other Ambulatory Visit (HOSPITAL_COMMUNITY): Payer: Self-pay

## 2022-03-07 ENCOUNTER — Other Ambulatory Visit (HOSPITAL_COMMUNITY): Payer: Self-pay

## 2022-03-20 DIAGNOSIS — M5416 Radiculopathy, lumbar region: Secondary | ICD-10-CM | POA: Insufficient documentation

## 2022-03-25 ENCOUNTER — Other Ambulatory Visit (HOSPITAL_COMMUNITY): Payer: Self-pay

## 2022-04-25 DIAGNOSIS — M5416 Radiculopathy, lumbar region: Secondary | ICD-10-CM | POA: Diagnosis not present

## 2022-05-13 DIAGNOSIS — R7989 Other specified abnormal findings of blood chemistry: Secondary | ICD-10-CM | POA: Diagnosis not present

## 2022-05-13 DIAGNOSIS — E7800 Pure hypercholesterolemia, unspecified: Secondary | ICD-10-CM | POA: Diagnosis not present

## 2022-05-13 DIAGNOSIS — I10 Essential (primary) hypertension: Secondary | ICD-10-CM | POA: Diagnosis not present

## 2022-05-20 ENCOUNTER — Other Ambulatory Visit (HOSPITAL_COMMUNITY): Payer: Self-pay

## 2022-05-20 DIAGNOSIS — Z0000 Encounter for general adult medical examination without abnormal findings: Secondary | ICD-10-CM | POA: Diagnosis not present

## 2022-05-20 DIAGNOSIS — M5431 Sciatica, right side: Secondary | ICD-10-CM | POA: Diagnosis not present

## 2022-05-20 DIAGNOSIS — E7800 Pure hypercholesterolemia, unspecified: Secondary | ICD-10-CM | POA: Diagnosis not present

## 2022-05-20 DIAGNOSIS — F325 Major depressive disorder, single episode, in full remission: Secondary | ICD-10-CM | POA: Diagnosis not present

## 2022-05-20 DIAGNOSIS — J302 Other seasonal allergic rhinitis: Secondary | ICD-10-CM | POA: Diagnosis not present

## 2022-05-20 DIAGNOSIS — G5601 Carpal tunnel syndrome, right upper limb: Secondary | ICD-10-CM | POA: Diagnosis not present

## 2022-05-20 DIAGNOSIS — M722 Plantar fascial fibromatosis: Secondary | ICD-10-CM | POA: Diagnosis not present

## 2022-05-20 DIAGNOSIS — R82998 Other abnormal findings in urine: Secondary | ICD-10-CM | POA: Diagnosis not present

## 2022-05-20 DIAGNOSIS — E669 Obesity, unspecified: Secondary | ICD-10-CM | POA: Diagnosis not present

## 2022-05-20 DIAGNOSIS — Z1339 Encounter for screening examination for other mental health and behavioral disorders: Secondary | ICD-10-CM | POA: Diagnosis not present

## 2022-05-20 DIAGNOSIS — Z1331 Encounter for screening for depression: Secondary | ICD-10-CM | POA: Diagnosis not present

## 2022-05-20 DIAGNOSIS — I10 Essential (primary) hypertension: Secondary | ICD-10-CM | POA: Diagnosis not present

## 2022-05-20 MED ORDER — FLUTICASONE PROPIONATE 50 MCG/ACT NA SUSP
1.0000 | Freq: Two times a day (BID) | NASAL | 3 refills | Status: DC
  Filled 2022-05-20: qty 16, 30d supply, fill #0

## 2022-05-20 MED ORDER — HYDROCHLOROTHIAZIDE 25 MG PO TABS
25.0000 mg | ORAL_TABLET | Freq: Every day | ORAL | 3 refills | Status: DC
  Filled 2022-07-17: qty 90, 90d supply, fill #0
  Filled 2022-11-10: qty 90, 90d supply, fill #1
  Filled 2023-04-24: qty 30, 30d supply, fill #2

## 2022-05-24 ENCOUNTER — Other Ambulatory Visit (HOSPITAL_COMMUNITY): Payer: Self-pay

## 2022-06-25 ENCOUNTER — Other Ambulatory Visit: Payer: Self-pay

## 2022-06-25 MED ORDER — AMOXICILLIN 500 MG PO CAPS
ORAL_CAPSULE | ORAL | 0 refills | Status: DC
  Filled 2022-06-25: qty 21, 7d supply, fill #0

## 2022-07-05 ENCOUNTER — Ambulatory Visit (INDEPENDENT_AMBULATORY_CARE_PROVIDER_SITE_OTHER): Payer: 59

## 2022-07-05 ENCOUNTER — Encounter: Payer: Self-pay | Admitting: Podiatry

## 2022-07-05 ENCOUNTER — Ambulatory Visit (INDEPENDENT_AMBULATORY_CARE_PROVIDER_SITE_OTHER): Payer: 59 | Admitting: Podiatry

## 2022-07-05 DIAGNOSIS — M7750 Other enthesopathy of unspecified foot: Secondary | ICD-10-CM

## 2022-07-05 DIAGNOSIS — M722 Plantar fascial fibromatosis: Secondary | ICD-10-CM | POA: Diagnosis not present

## 2022-07-05 DIAGNOSIS — M7751 Other enthesopathy of right foot: Secondary | ICD-10-CM | POA: Diagnosis not present

## 2022-07-05 NOTE — Progress Notes (Signed)
Subjective:  Patient ID: [name], female    DOB: Jul 10, 1976,  MRN: [id number]  Chief Complaint  Patient presents with   Foot Pain    xray)np right foot pin/ per Dr [name] pt has plantar fasciitis    46 y.o. female presents with the above complaint.  Patient presents with complaint of right Planter fasciitis heel pain.  Patient has been going on for few months has progressive gotten worse.  Is been on since May with some swelling.  The left side started to act out.  She went to get it evaluated she has not seen MRIs prior to seeing me.  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  She wears [facility].  Pain scale 7 out of 10 hurts with ambulation.   Review of Systems: Negative except as noted in the HPI. Denies N/V/F/Ch.  Past Medical History:  Diagnosis Date   Hypertension     Current Outpatient Medications:    fluticasone (FLONASE) 50 MCG/ACT nasal spray, Use 1 spray in each nostril 2 times daily, Disp: 48 g, Rfl: 3   fluticasone (FLONASE) 50 MCG/ACT nasal spray, Place 1 spray into both nostrils 2 (two) times daily., Disp: 48 g, Rfl: 3   hydrochlorothiazide (HYDRODIURIL) 25 MG tablet, Take 1 tablet (25 mg total) by mouth daily., Disp: 90 tablet, Rfl: 3   ibuprofen (ADVIL) 600 MG tablet, Take 1 tablet (600 mg total) by mouth every 6 (six) hours as needed for mild pain., Disp: 30 tablet, Rfl: 0   mometasone (NASONEX) 50 MCG/ACT nasal spray, Place 1 spray into the nose 2 (two) times daily., Disp: 51 g, Rfl: 3   montelukast (SINGULAIR) 10 MG tablet, TAKE 1 TABLET BY MOUTH ONCE DAILY., Disp: 90 tablet, Rfl: 3   Norethindrone Acetate-Ethinyl Estrad-FE (LOESTRIN 24 FE) 1-20 MG-MCG(24) tablet, TAKE 1 TABLET BY MOUTH DAILY., Disp: 84 tablet, Rfl: 4  Social History   Tobacco Use  Smoking Status Never  Smokeless Tobacco Never    Allergies  Allergen Reactions   Ibuprofen Rash   Objective:  There were no vitals filed for this  visit. There is no height or weight on file to calculate BMI. Constitutional Well developed. Well nourished.  Vascular Dorsalis pedis pulses palpable bilaterally. Posterior tibial pulses palpable bilaterally. Capillary refill normal to all digits.  No cyanosis or clubbing noted. Pedal hair growth normal.  Neurologic Normal speech. Oriented to person, place, and time. Epicritic sensation to light touch grossly present bilaterally.  Dermatologic Nails well groomed and normal in appearance. No open wounds. No skin lesions.  Orthopedic: Normal joint ROM without pain or crepitus bilaterally. No visible deformities. Tender to palpation at the calcaneal tuber right. No pain with calcaneal squeeze right. Ankle ROM diminished range of motion right. Silfverskiold Test: positive right.   Radiographs: Taken and reviewed. No acute fractures or dislocations. No evidence of stress fracture.  Plantar heel spur present. Posterior heel spur absent.  Pes planovalgus foot structure  Assessment:   1. Plantar fasciitis of right foot    Plan:  Patient was evaluated and treated and all questions answered.  Plantar Fasciitis, right - XR reviewed as above.  - Educated on icing and stretching. Instructions given.  - Injection delivered to the plantar fascia as below. - DME: Plantar fascial brace dispensed to support the medial longitudinal arch of the foot and offload pressure from the heel and prevent arch collapse during weightbearing - Pharmacologic  management: None  Procedure: Injection Tendon/Ligament Location: Right plantar fascia at the glabrous junction; medial approach. Skin Prep: alcohol Injectate: 0.5 cc 0.5% marcaine plain, 0.5 cc of 1% Lidocaine, 0.5 cc kenalog 10. Disposition: Patient tolerated procedure well. Injection site dressed with a band-aid.  No follow-ups on file.

## 2022-07-11 ENCOUNTER — Ambulatory Visit: Payer: 59 | Admitting: Podiatry

## 2022-07-17 ENCOUNTER — Other Ambulatory Visit (HOSPITAL_COMMUNITY): Payer: Self-pay

## 2022-07-17 DIAGNOSIS — Z1231 Encounter for screening mammogram for malignant neoplasm of breast: Secondary | ICD-10-CM | POA: Diagnosis not present

## 2022-07-19 ENCOUNTER — Encounter: Payer: Self-pay | Admitting: Obstetrics & Gynecology

## 2022-07-31 DIAGNOSIS — R928 Other abnormal and inconclusive findings on diagnostic imaging of breast: Secondary | ICD-10-CM | POA: Diagnosis not present

## 2022-07-31 DIAGNOSIS — R922 Inconclusive mammogram: Secondary | ICD-10-CM | POA: Diagnosis not present

## 2022-08-07 ENCOUNTER — Ambulatory Visit (INDEPENDENT_AMBULATORY_CARE_PROVIDER_SITE_OTHER): Payer: 59 | Admitting: Podiatry

## 2022-08-07 DIAGNOSIS — M722 Plantar fascial fibromatosis: Secondary | ICD-10-CM

## 2022-08-07 DIAGNOSIS — M7662 Achilles tendinitis, left leg: Secondary | ICD-10-CM | POA: Diagnosis not present

## 2022-08-07 NOTE — Progress Notes (Signed)
Subjective:  Patient ID: EMALIE MCWETHY, female    DOB: May 24, 1976,  MRN: [id number]  Chief Complaint  Patient presents with   Plantar Fasciitis    46 y.o. female presents with the above complaint.  Patient presents for follow-up of right Planter fasciitis.  He states he is doing a lot better.  She states that now she is at the left side started to act up to left Achilles insertion and hurts with ambulation she would like to discuss treatment options for the left side   Review of Systems: Negative except as noted in the HPI. Denies N/V/F/Ch.  Past Medical History:  Diagnosis Date   Hypertension     Current Outpatient Medications:    fluticasone (FLONASE) 50 MCG/ACT nasal spray, Use 1 spray in each nostril 2 times daily, Disp: 48 g, Rfl: 3   fluticasone (FLONASE) 50 MCG/ACT nasal spray, Place 1 spray into both nostrils 2 (two) times daily., Disp: 48 g, Rfl: 3   hydrochlorothiazide (HYDRODIURIL) 25 MG tablet, Take 1 tablet (25 mg total) by mouth daily., Disp: 90 tablet, Rfl: 3   ibuprofen (ADVIL) 600 MG tablet, Take 1 tablet (600 mg total) by mouth every 6 (six) hours as needed for mild pain., Disp: 30 tablet, Rfl: 0   mometasone (NASONEX) 50 MCG/ACT nasal spray, Place 1 spray into the nose 2 (two) times daily., Disp: 51 g, Rfl: 3   montelukast (SINGULAIR) 10 MG tablet, TAKE 1 TABLET BY MOUTH ONCE DAILY., Disp: 90 tablet, Rfl: 3   Norethindrone Acetate-Ethinyl Estrad-FE (LOESTRIN 24 FE) 1-20 MG-MCG(24) tablet, TAKE 1 TABLET BY MOUTH DAILY., Disp: 84 tablet, Rfl: 4  Social History   Tobacco Use  Smoking Status Never  Smokeless Tobacco Never    Allergies  Allergen Reactions   Ibuprofen Rash   Objective:  There were no vitals filed for this visit. There is no height or weight on file to calculate BMI. Constitutional Well developed. Well nourished.  Vascular Dorsalis pedis pulses palpable bilaterally. Posterior tibial pulses palpable bilaterally. Capillary refill  normal to all digits.  No cyanosis or clubbing noted. Pedal hair growth normal.  Neurologic Normal speech. Oriented to person, place, and time. Epicritic sensation to light touch grossly present bilaterally.  Dermatologic Nails well groomed and normal in appearance. No open wounds. No skin lesions.  Orthopedic:  Normal joint ROM without pain or crepitus bilaterally. No visible deformities. No further tender to palpation at the calcaneal tuber right. No pain with calcaneal squeeze right. Ankle ROM diminished range of motion right. Silfverskiold Test: positive right.  Positive Silfverskiold test noted with gastrocnemius equinus.  Pain on palpation to the Achilles tendon insertion pain with dorsiflexion of the ankle joint no pain with plantarflexion of the ankle joint.  No pain at the ATFL ligament posterior tibial tendon and peroneal tendon.   Radiographs: Taken and reviewed. No acute fractures or dislocations. No evidence of stress fracture.  Plantar heel spur present. Posterior heel spur absent.  Pes planovalgus foot structure  Assessment:   1. Achilles tendinitis, left leg   2. Plantar fasciitis of right foot     Plan:  Patient was evaluated and treated and all questions answered.  Left Achilles tendinitis -I explained to the etiology of tendinitis and worse treatment options were discussed.  Given the amount of pain that she is having I believe she will benefit from cam boot immobilization allow the soft tissue structure to heal appropriately.  We will discuss steroid injection during next clinical visit.  -  Cam boot was dispensed  Plantar Fasciitis, right -Clinically noted.  At this time I discussed prevention orthotics management  No follow-ups on file.   Left achilles tendnits cam boot  Right PF healed   Pesplanovalgus orthotics

## 2022-08-19 ENCOUNTER — Other Ambulatory Visit (HOSPITAL_COMMUNITY): Payer: Self-pay

## 2022-08-20 ENCOUNTER — Other Ambulatory Visit (HOSPITAL_COMMUNITY): Payer: Self-pay

## 2022-09-05 ENCOUNTER — Ambulatory Visit (INDEPENDENT_AMBULATORY_CARE_PROVIDER_SITE_OTHER): Payer: 59 | Admitting: Podiatry

## 2022-09-05 ENCOUNTER — Ambulatory Visit: Payer: 59 | Admitting: Podiatry

## 2022-09-05 DIAGNOSIS — M722 Plantar fascial fibromatosis: Secondary | ICD-10-CM

## 2022-09-05 DIAGNOSIS — M7662 Achilles tendinitis, left leg: Secondary | ICD-10-CM | POA: Diagnosis not present

## 2022-09-05 NOTE — Progress Notes (Signed)
Subjective:  Patient ID: [name], female    DOB: 05-18-1976,  MRN: [id number]  Chief Complaint  Patient presents with   Plantar Fasciitis    46 y.o. female presents with the above complaint.  Patient presents with right Planter fasciitis.  She states that he started to act back up again.  The Achilles tendinitis is doing well.  She does not have much pain there.  She denies any other acute complaint she is also here to pick up orthotics   Review of Systems: Negative except as noted in the HPI. Denies N/V/F/Ch.  Past Medical History:  Diagnosis Date   Hypertension     Current Outpatient Medications:    fluticasone (FLONASE) 50 MCG/ACT nasal spray, Use 1 spray in each nostril 2 times daily, Disp: 48 g, Rfl: 3   fluticasone (FLONASE) 50 MCG/ACT nasal spray, Place 1 spray into both nostrils 2 (two) times daily., Disp: 48 g, Rfl: 3   hydrochlorothiazide (HYDRODIURIL) 25 MG tablet, Take 1 tablet (25 mg total) by mouth daily., Disp: 90 tablet, Rfl: 3   ibuprofen (ADVIL) 600 MG tablet, Take 1 tablet (600 mg total) by mouth every 6 (six) hours as needed for mild pain., Disp: 30 tablet, Rfl: 0   mometasone (NASONEX) 50 MCG/ACT nasal spray, Place 1 spray into the nose 2 (two) times daily., Disp: 51 g, Rfl: 3   montelukast (SINGULAIR) 10 MG tablet, TAKE 1 TABLET BY MOUTH ONCE DAILY., Disp: 90 tablet, Rfl: 3   Norethindrone Acetate-Ethinyl Estrad-FE (LOESTRIN 24 FE) 1-20 MG-MCG(24) tablet, TAKE 1 TABLET BY MOUTH DAILY., Disp: 84 tablet, Rfl: 4  Social History   Tobacco Use  Smoking Status Never  Smokeless Tobacco Never    Allergies  Allergen Reactions   Ibuprofen Rash   Objective:  There were no vitals filed for this visit. There is no height or weight on file to calculate BMI. Constitutional Well developed. Well nourished.  Vascular Dorsalis pedis pulses palpable bilaterally. Posterior tibial pulses palpable bilaterally. Capillary refill normal to all digits.  No  cyanosis or clubbing noted. Pedal hair growth normal.  Neurologic Normal speech. Oriented to person, place, and time. Epicritic sensation to light touch grossly present bilaterally.  Dermatologic Nails well groomed and normal in appearance. No open wounds. No skin lesions.  Orthopedic: Normal joint ROM without pain or crepitus bilaterally. No visible deformities. Tender to palpation at the calcaneal tuber right. No pain with calcaneal squeeze right. Ankle ROM diminished range of motion right. Silfverskiold Test: positive right.   Radiographs: Taken and reviewed. No acute fractures or dislocations. No evidence of stress fracture.  Plantar heel spur present. Posterior heel spur absent.  Pes planovalgus foot structure  Assessment:   1. Plantar fasciitis of right foot   2. Achilles tendinitis, left leg     Plan:  Patient was evaluated and treated and all questions answered.  Left Achilles tendinitis -Clinic resolved Plantar Fasciitis, right - XR reviewed as above.  - Educated on icing and stretching. Instructions given.  -Second injection delivered to the plantar fascia as below. - DME: Continue plantar fascial brace.  Night splint was dispensed - Pharmacologic management: None  -Pes planovalgus -I explained to patient the etiology of pes planovalgus and relationship with Planter fasciitis and various treatment options were discussed.  Given patient foot structure in the setting of Planter fasciitis I believe patient will benefit from custom-made orthotics to help control the hindfoot motion support the arch of the foot and take the stress away  from plantar fascial.  Patient agrees with the plan like to proceed with orthotics -Orthotics were dispensed patient.  They are functioning well  Procedure: Injection Tendon/Ligament Location: Right plantar fascia at the glabrous junction; medial approach. Skin Prep: alcohol Injectate: 0.5 cc 0.5% marcaine plain, 0.5 cc of 1% Lidocaine,  0.5 cc kenalog 10. Disposition: Patient tolerated procedure well. Injection site dressed with a band-aid.  No follow-ups on file.

## 2022-10-09 ENCOUNTER — Ambulatory Visit (INDEPENDENT_AMBULATORY_CARE_PROVIDER_SITE_OTHER): Payer: 59 | Admitting: Podiatry

## 2022-10-09 ENCOUNTER — Ambulatory Visit: Payer: 59 | Admitting: Podiatry

## 2022-10-09 DIAGNOSIS — M722 Plantar fascial fibromatosis: Secondary | ICD-10-CM | POA: Diagnosis not present

## 2022-10-09 NOTE — Progress Notes (Signed)
Subjective:  Patient ID: [name], female    DOB: 14-Apr-1976,  MRN: [id number]  Chief Complaint  Patient presents with   Plantar Fasciitis    Patient states Bilateral Pf is better since the last visit.    Heel Spurs    Right foot heel spur is constant pain , states she wears her night splint often.   Foot Orthotics    Patient states she is concerned about her left foot orthotic states it feels like she is walking at a angle     46 y.o. female presents with the above complaint.  Presents for follow-up of Planter fasciitis.  She states she is doing better than when she first came to see me but she still has some pain.  At this time she is able to clinically manage.  Review of Systems: Negative except as noted in the HPI. Denies N/V/F/Ch.  Past Medical History:  Diagnosis Date   Hypertension     Current Outpatient Medications:    fluticasone (FLONASE) 50 MCG/ACT nasal spray, Use 1 spray in each nostril 2 times daily, Disp: 48 g, Rfl: 3   fluticasone (FLONASE) 50 MCG/ACT nasal spray, Place 1 spray into both nostrils 2 (two) times daily., Disp: 48 g, Rfl: 3   hydrochlorothiazide (HYDRODIURIL) 25 MG tablet, Take 1 tablet (25 mg total) by mouth daily., Disp: 90 tablet, Rfl: 3   ibuprofen (ADVIL) 600 MG tablet, Take 1 tablet (600 mg total) by mouth every 6 (six) hours as needed for mild pain., Disp: 30 tablet, Rfl: 0   mometasone (NASONEX) 50 MCG/ACT nasal spray, Place 1 spray into the nose 2 (two) times daily., Disp: 51 g, Rfl: 3   montelukast (SINGULAIR) 10 MG tablet, TAKE 1 TABLET BY MOUTH ONCE DAILY., Disp: 90 tablet, Rfl: 3   Norethindrone Acetate-Ethinyl Estrad-FE (LOESTRIN 24 FE) 1-20 MG-MCG(24) tablet, TAKE 1 TABLET BY MOUTH DAILY., Disp: 84 tablet, Rfl: 4  Social History   Tobacco Use  Smoking Status Never  Smokeless Tobacco Never    Allergies  Allergen Reactions   Ibuprofen Rash   Objective:  There were no vitals filed for this visit. There is no height  or weight on file to calculate BMI. Constitutional Well developed. Well nourished.  Vascular Dorsalis pedis pulses palpable bilaterally. Posterior tibial pulses palpable bilaterally. Capillary refill normal to all digits.  No cyanosis or clubbing noted. Pedal hair growth normal.  Neurologic Normal speech. Oriented to person, place, and time. Epicritic sensation to light touch grossly present bilaterally.  Dermatologic Nails well groomed and normal in appearance. No open wounds. No skin lesions.  Orthopedic: Normal joint ROM without pain or crepitus bilaterally. No visible deformities. Mild tender to palpation at the calcaneal tuber right. No pain with calcaneal squeeze right. Ankle ROM diminished range of motion right. Silfverskiold Test: positive right.   Radiographs: Taken and reviewed. No acute fractures or dislocations. No evidence of stress fracture.  Plantar heel spur present. Posterior heel spur absent.  Pes planovalgus foot structure  Assessment:   1. Plantar fasciitis of right foot      Plan:  Patient was evaluated and treated and all questions answered.  Bilateral Planter fasciitis -Clinically clinically her pain is being managed well.  She states that her orthotics are functioning okay.  She is doing much better than when she first came to see me.  I discussed shoe gear modification as well.  If any foot and ankle issues arise in future of asked her to come back  and see me.  -Pes planovalgus -I explained to patient the etiology of pes planovalgus and relationship with Planter fasciitis and various treatment options were discussed.  Given patient foot structure in the setting of Planter fasciitis I believe patient will benefit from custom-made orthotics to help control the hindfoot motion support the arch of the foot and take the stress away from plantar fascial.  Patient agrees with the plan like to proceed with orthotics -Orthotics were dispensed patient.  They are  functioning well    No follow-ups on file.

## 2022-10-28 DIAGNOSIS — M25562 Pain in left knee: Secondary | ICD-10-CM | POA: Diagnosis not present

## 2022-10-29 ENCOUNTER — Other Ambulatory Visit (HOSPITAL_COMMUNITY): Payer: Self-pay

## 2022-10-29 MED ORDER — METHOCARBAMOL 500 MG PO TABS
500.0000 mg | ORAL_TABLET | Freq: Two times a day (BID) | ORAL | 0 refills | Status: DC
  Filled 2022-10-29 – 2022-11-07 (×2): qty 20, 10d supply, fill #0

## 2022-11-06 ENCOUNTER — Other Ambulatory Visit (HOSPITAL_COMMUNITY): Payer: Self-pay

## 2022-11-07 ENCOUNTER — Other Ambulatory Visit (HOSPITAL_COMMUNITY): Payer: Self-pay

## 2022-11-08 ENCOUNTER — Other Ambulatory Visit (HOSPITAL_COMMUNITY): Payer: Self-pay

## 2022-11-11 ENCOUNTER — Other Ambulatory Visit (HOSPITAL_COMMUNITY): Payer: Self-pay

## 2022-11-12 DIAGNOSIS — M5136 Other intervertebral disc degeneration, lumbar region: Secondary | ICD-10-CM | POA: Diagnosis not present

## 2023-02-03 ENCOUNTER — Other Ambulatory Visit: Payer: Self-pay

## 2023-04-24 ENCOUNTER — Other Ambulatory Visit: Payer: Self-pay | Admitting: Obstetrics & Gynecology

## 2023-04-25 ENCOUNTER — Other Ambulatory Visit (HOSPITAL_COMMUNITY): Payer: Self-pay

## 2023-04-25 ENCOUNTER — Other Ambulatory Visit: Payer: Self-pay

## 2023-04-29 ENCOUNTER — Other Ambulatory Visit (HOSPITAL_COMMUNITY): Payer: Self-pay

## 2023-04-30 ENCOUNTER — Encounter: Payer: Self-pay | Admitting: Obstetrics & Gynecology

## 2023-04-30 ENCOUNTER — Other Ambulatory Visit (HOSPITAL_COMMUNITY): Payer: Self-pay

## 2023-04-30 ENCOUNTER — Ambulatory Visit (INDEPENDENT_AMBULATORY_CARE_PROVIDER_SITE_OTHER): Payer: Commercial Managed Care - PPO | Admitting: Obstetrics & Gynecology

## 2023-04-30 VITALS — BP 136/80 | HR 94 | Ht 66.25 in | Wt 218.0 lb

## 2023-04-30 DIAGNOSIS — Z01419 Encounter for gynecological examination (general) (routine) without abnormal findings: Secondary | ICD-10-CM

## 2023-04-30 DIAGNOSIS — G5602 Carpal tunnel syndrome, left upper limb: Secondary | ICD-10-CM | POA: Diagnosis not present

## 2023-04-30 DIAGNOSIS — Z3041 Encounter for surveillance of contraceptive pills: Secondary | ICD-10-CM | POA: Diagnosis not present

## 2023-04-30 MED ORDER — NORETHIN ACE-ETH ESTRAD-FE 1-20 MG-MCG(24) PO TABS
1.0000 | ORAL_TABLET | Freq: Every day | ORAL | 4 refills | Status: DC
  Filled 2023-04-30: qty 84, 84d supply, fill #0
  Filled 2023-07-20: qty 84, 84d supply, fill #1
  Filled 2023-10-12: qty 84, 84d supply, fill #2
  Filled 2024-01-04: qty 84, 84d supply, fill #3
  Filled 2024-03-26: qty 84, 84d supply, fill #4

## 2023-04-30 NOTE — Progress Notes (Signed)
[name] Sep 06, 1976 [id number]   History:    47 y.o. G4P1A3L1 Married. Graduated as NP.  Son is 62 yo.     RP:  Established patient presenting for annual gyn exam    HPI: Well on LoEstrin 24 Fe 1/20 generic.  Lighter menstrual flow. No BTB.  No pelvic pain.  No pain with IC. Pap Neg 12/2020. Repeat Pap at 1 years. Urine/BMs normal.  Breasts normal. Mammo Neg 06/2022. BMI 34.92.  On a low Calorie/Carb diet with [employer].  Walking.  [facility] with Dr Wylene Simmer.  COLONOSCOPY: 07-11-21.   Past medical history,surgical history, family history and social history were all reviewed and documented in the EPIC chart.  Gynecologic History Patient's last menstrual period was 04/05/2023 (exact date).  Obstetric History OB History  Gravida Para Term Preterm AB Living  4 1 1   3 1   SAB IAB Ectopic Multiple Live Births  3       1    # Outcome Date GA Lbr Len/2nd Weight Sex Delivery Anes PTL Lv  4 SAB           3 SAB           2 SAB           1 Term              ROS: A ROS was performed and pertinent positives and negatives are included in the history. GENERAL: No fevers or chills. HEENT: No change in vision, no earache, sore throat or sinus congestion. NECK: No pain or stiffness. CARDIOVASCULAR: No chest pain or pressure. No palpitations. PULMONARY: No shortness of breath, cough or wheeze. GASTROINTESTINAL: No abdominal pain, nausea, vomiting or diarrhea, melena or bright red blood per rectum. GENITOURINARY: No urinary frequency, urgency, hesitancy or dysuria. MUSCULOSKELETAL: No joint or muscle pain, no back pain, no recent trauma. DERMATOLOGIC: No rash, no itching, no lesions. ENDOCRINE: No polyuria, polydipsia, no heat or cold intolerance. No recent change in weight. HEMATOLOGICAL: No anemia or easy bruising or bleeding. NEUROLOGIC: No headache, seizures, numbness, tingling or weakness. PSYCHIATRIC: No depression, no loss of interest in normal activity or change in sleep pattern.      Exam:   BP 136/80   Pulse 94   Ht 5' 6.25" (1.683 m)   Wt 218 lb (98.9 kg)   LMP 04/05/2023 (Exact Date) Comment: sexually active-ocps  SpO2 99%   BMI 34.92 kg/m   Body mass index is 34.92 kg/m.  General appearance : Well developed well nourished female. No acute distress HEENT: Eyes: no retinal hemorrhage or exudates,  Neck supple, trachea midline, no carotid bruits, no thyroidmegaly Lungs: Clear to auscultation, no rhonchi or wheezes, or rib retractions  Heart: Regular rate and rhythm, no murmurs or gallops Breast:Examined in sitting and supine position were symmetrical in appearance, no palpable masses or tenderness,  no skin retraction, no nipple inversion, no nipple discharge, no skin discoloration, no axillary or supraclavicular lymphadenopathy Abdomen: no palpable masses or tenderness, no rebound or guarding Extremities: no edema or skin discoloration or tenderness  Pelvic: Vulva: Normal             Vagina: No gross lesions or discharge  Cervix: No gross lesions or discharge  Uterus  AV, normal size, shape and consistency, non-tender and mobile  Adnexa  Without masses or tenderness  Anus: Normal   Assessment/Plan:  47 y.o. female for annual exam   1. Well female exam with routine gynecological exam Well  on LoEstrin 24 Fe 1/20 generic.  Lighter menstrual flow. No BTB.  No pelvic pain.  No pain with IC. Pap Neg 12/2020. Repeat Pap at 3 years. Urine/BMs normal.  Breasts normal. Mammo Neg 06/2022. BMI 34.92.  On a low Calorie/Carb diet with [employer].  Walking.  [facility] with Dr Wylene Simmer.  COLONOSCOPY: 07-11-21.  2. Encounter for surveillance of contraceptive pills Well on LoEstrin 24 Fe 1/20 generic.  Lighter menstrual flow. No BTB.  No pelvic pain.  No pain with IC. No CI to continue on BCPs.  Prescription sent to pharmacy.  Other orders - Norethindrone Acetate-Ethinyl Estrad-FE (LOESTRIN 24 FE) 1-20 MG-MCG(24) tablet; TAKE 1 TABLET BY MOUTH DAILY.   Genia Del MD,  3:44 PM

## 2023-05-12 DIAGNOSIS — H16101 Unspecified superficial keratitis, right eye: Secondary | ICD-10-CM | POA: Diagnosis not present

## 2023-05-19 DIAGNOSIS — H16101 Unspecified superficial keratitis, right eye: Secondary | ICD-10-CM | POA: Diagnosis not present

## 2023-06-02 DIAGNOSIS — I10 Essential (primary) hypertension: Secondary | ICD-10-CM | POA: Diagnosis not present

## 2023-06-02 DIAGNOSIS — R7301 Impaired fasting glucose: Secondary | ICD-10-CM | POA: Diagnosis not present

## 2023-06-02 DIAGNOSIS — E7800 Pure hypercholesterolemia, unspecified: Secondary | ICD-10-CM | POA: Diagnosis not present

## 2023-06-09 ENCOUNTER — Other Ambulatory Visit (HOSPITAL_COMMUNITY): Payer: Self-pay

## 2023-06-09 DIAGNOSIS — R82998 Other abnormal findings in urine: Secondary | ICD-10-CM | POA: Diagnosis not present

## 2023-06-09 MED ORDER — FLUTICASONE PROPIONATE 50 MCG/ACT NA SUSP
1.0000 | Freq: Two times a day (BID) | NASAL | 3 refills | Status: DC
Start: 2023-06-09 — End: 2024-07-08
  Filled 2023-06-09: qty 16, 30d supply, fill #0
  Filled 2023-11-18: qty 16, 30d supply, fill #1

## 2023-06-09 MED ORDER — HYDROCHLOROTHIAZIDE 25 MG PO TABS
25.0000 mg | ORAL_TABLET | Freq: Every day | ORAL | 3 refills | Status: DC
  Filled 2023-06-09: qty 90, 90d supply, fill #0
  Filled 2023-10-12: qty 90, 90d supply, fill #1
  Filled 2024-02-12: qty 90, 90d supply, fill #2

## 2023-07-01 DIAGNOSIS — M5416 Radiculopathy, lumbar region: Secondary | ICD-10-CM | POA: Diagnosis not present

## 2023-07-22 ENCOUNTER — Other Ambulatory Visit (HOSPITAL_COMMUNITY): Payer: Self-pay

## 2023-07-23 DIAGNOSIS — Z1231 Encounter for screening mammogram for malignant neoplasm of breast: Secondary | ICD-10-CM | POA: Diagnosis not present

## 2023-10-21 ENCOUNTER — Other Ambulatory Visit (HOSPITAL_COMMUNITY): Payer: Self-pay

## 2023-10-21 DIAGNOSIS — G5602 Carpal tunnel syndrome, left upper limb: Secondary | ICD-10-CM | POA: Diagnosis not present

## 2023-10-21 MED ORDER — OXYCODONE HCL 5 MG PO TABS
5.0000 mg | ORAL_TABLET | ORAL | 0 refills | Status: DC | PRN
  Filled 2023-10-21: qty 42, 7d supply, fill #0

## 2023-10-21 MED ORDER — CEPHALEXIN 500 MG PO CAPS
500.0000 mg | ORAL_CAPSULE | Freq: Four times a day (QID) | ORAL | 0 refills | Status: DC
  Filled 2023-10-21: qty 28, 7d supply, fill #0

## 2023-10-22 ENCOUNTER — Other Ambulatory Visit (HOSPITAL_COMMUNITY): Payer: Self-pay

## 2023-10-22 MED ORDER — ONDANSETRON 4 MG PO TBDP
4.0000 mg | ORAL_TABLET | Freq: Three times a day (TID) | ORAL | 0 refills | Status: DC | PRN
  Filled 2023-10-22 (×2): qty 15, 5d supply, fill #0

## 2023-11-05 DIAGNOSIS — S6412XD Injury of median nerve at wrist and hand level of left arm, subsequent encounter: Secondary | ICD-10-CM | POA: Diagnosis not present

## 2023-11-05 DIAGNOSIS — M25642 Stiffness of left hand, not elsewhere classified: Secondary | ICD-10-CM | POA: Diagnosis not present

## 2023-11-14 DIAGNOSIS — S6412XD Injury of median nerve at wrist and hand level of left arm, subsequent encounter: Secondary | ICD-10-CM | POA: Diagnosis not present

## 2023-11-14 DIAGNOSIS — M25642 Stiffness of left hand, not elsewhere classified: Secondary | ICD-10-CM | POA: Diagnosis not present

## 2023-11-18 ENCOUNTER — Other Ambulatory Visit (HOSPITAL_COMMUNITY): Payer: Self-pay

## 2023-11-21 DIAGNOSIS — S6412XD Injury of median nerve at wrist and hand level of left arm, subsequent encounter: Secondary | ICD-10-CM | POA: Diagnosis not present

## 2023-11-21 DIAGNOSIS — M25642 Stiffness of left hand, not elsewhere classified: Secondary | ICD-10-CM | POA: Diagnosis not present

## 2023-11-26 DIAGNOSIS — M25642 Stiffness of left hand, not elsewhere classified: Secondary | ICD-10-CM | POA: Diagnosis not present

## 2023-11-26 DIAGNOSIS — S6412XD Injury of median nerve at wrist and hand level of left arm, subsequent encounter: Secondary | ICD-10-CM | POA: Diagnosis not present

## 2023-12-08 DIAGNOSIS — S6412XD Injury of median nerve at wrist and hand level of left arm, subsequent encounter: Secondary | ICD-10-CM | POA: Diagnosis not present

## 2023-12-08 DIAGNOSIS — M25642 Stiffness of left hand, not elsewhere classified: Secondary | ICD-10-CM | POA: Diagnosis not present

## 2023-12-15 DIAGNOSIS — M5431 Sciatica, right side: Secondary | ICD-10-CM | POA: Diagnosis not present

## 2023-12-15 DIAGNOSIS — F325 Major depressive disorder, single episode, in full remission: Secondary | ICD-10-CM | POA: Diagnosis not present

## 2023-12-15 DIAGNOSIS — I10 Essential (primary) hypertension: Secondary | ICD-10-CM | POA: Diagnosis not present

## 2023-12-15 DIAGNOSIS — E669 Obesity, unspecified: Secondary | ICD-10-CM | POA: Diagnosis not present

## 2023-12-15 DIAGNOSIS — J302 Other seasonal allergic rhinitis: Secondary | ICD-10-CM | POA: Diagnosis not present

## 2023-12-15 DIAGNOSIS — R7301 Impaired fasting glucose: Secondary | ICD-10-CM | POA: Diagnosis not present

## 2023-12-15 DIAGNOSIS — M722 Plantar fascial fibromatosis: Secondary | ICD-10-CM | POA: Diagnosis not present

## 2023-12-15 DIAGNOSIS — G5601 Carpal tunnel syndrome, right upper limb: Secondary | ICD-10-CM | POA: Diagnosis not present

## 2023-12-15 DIAGNOSIS — E7800 Pure hypercholesterolemia, unspecified: Secondary | ICD-10-CM | POA: Diagnosis not present

## 2024-01-06 IMAGING — DX DG CHEST 2V
2 series · 2 of 2 positions shown · non-contrast
Comparison: None.

CLINICAL DATA: Left-sided chest pain and dizziness.

EXAM:
CHEST - 2 VIEW

[chest pa]
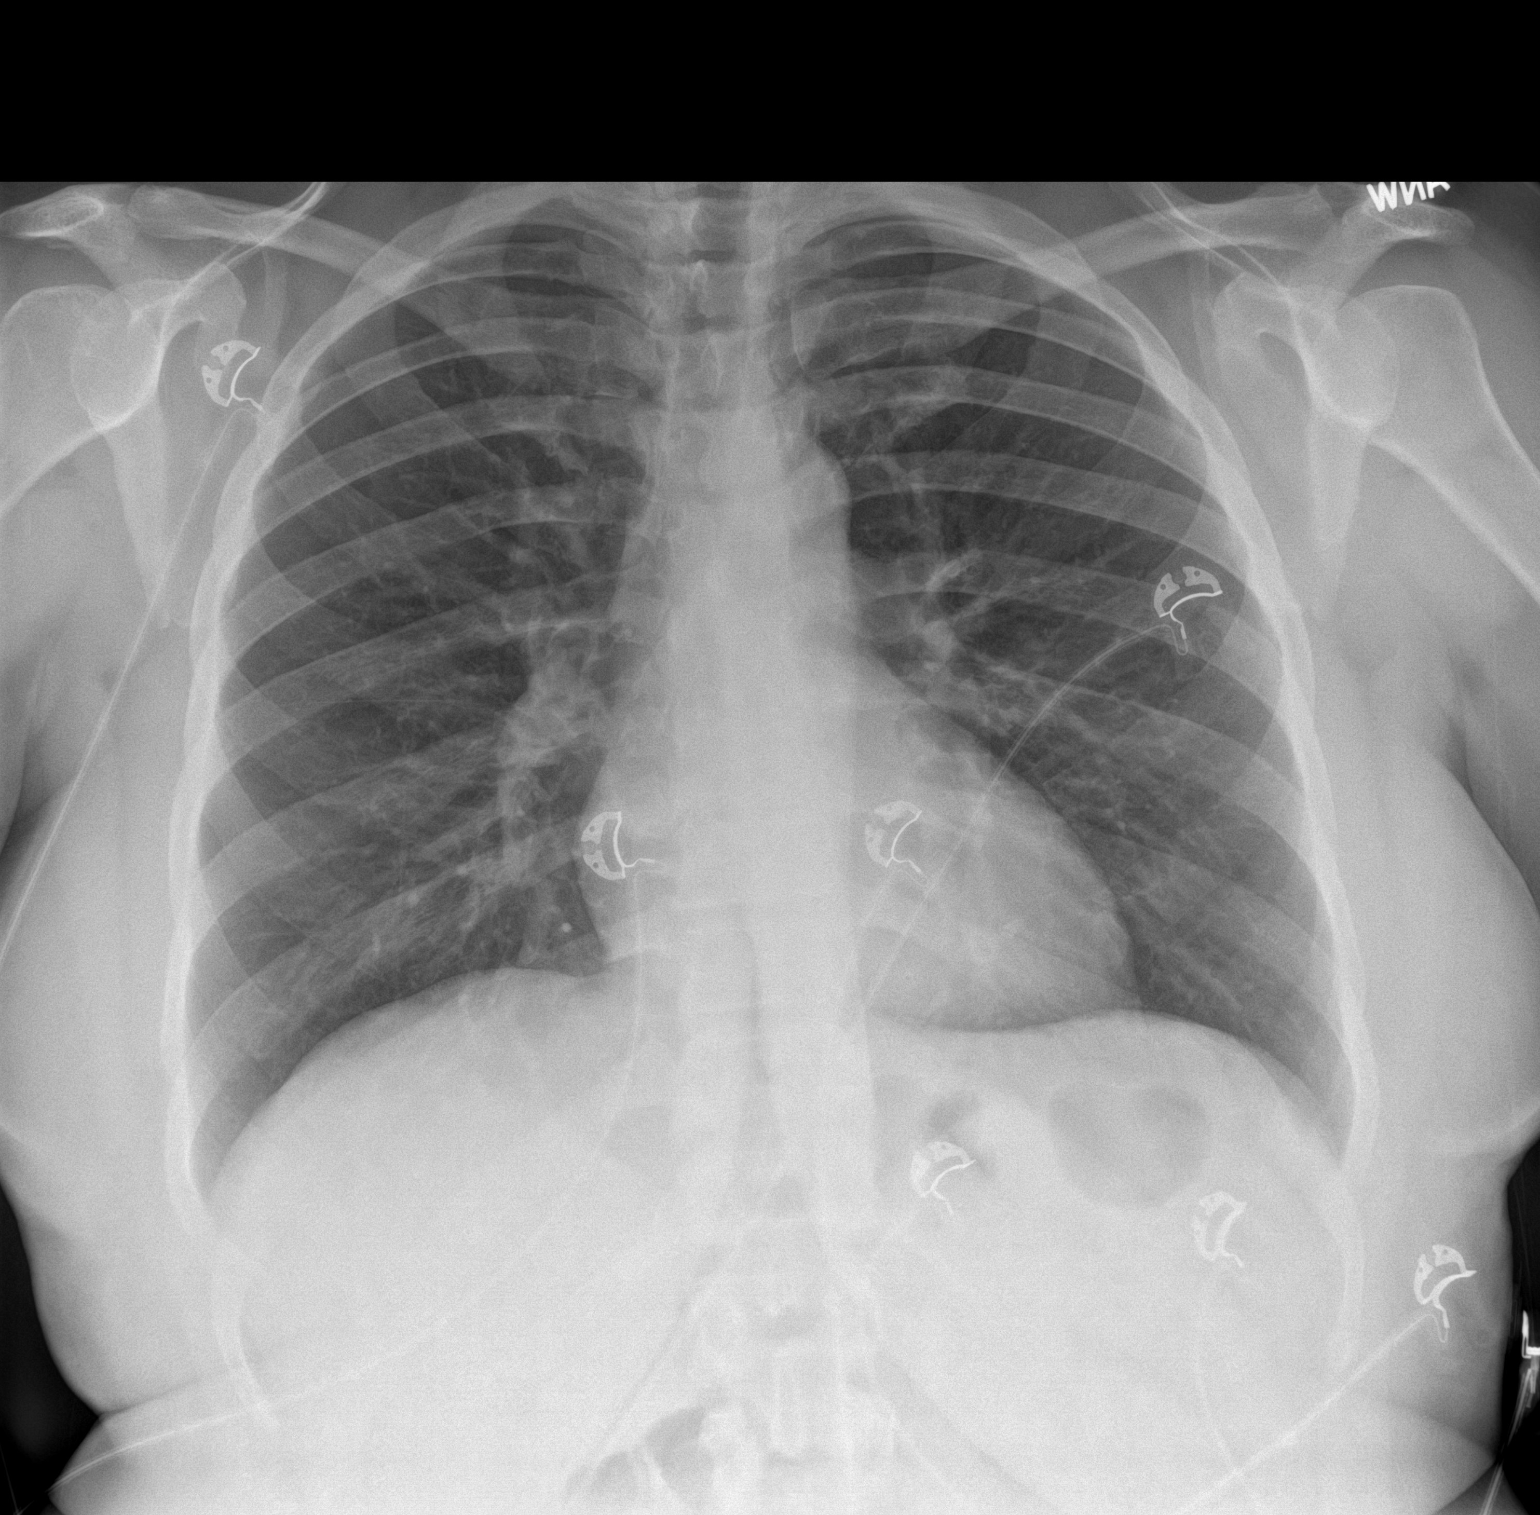

[chest lat]
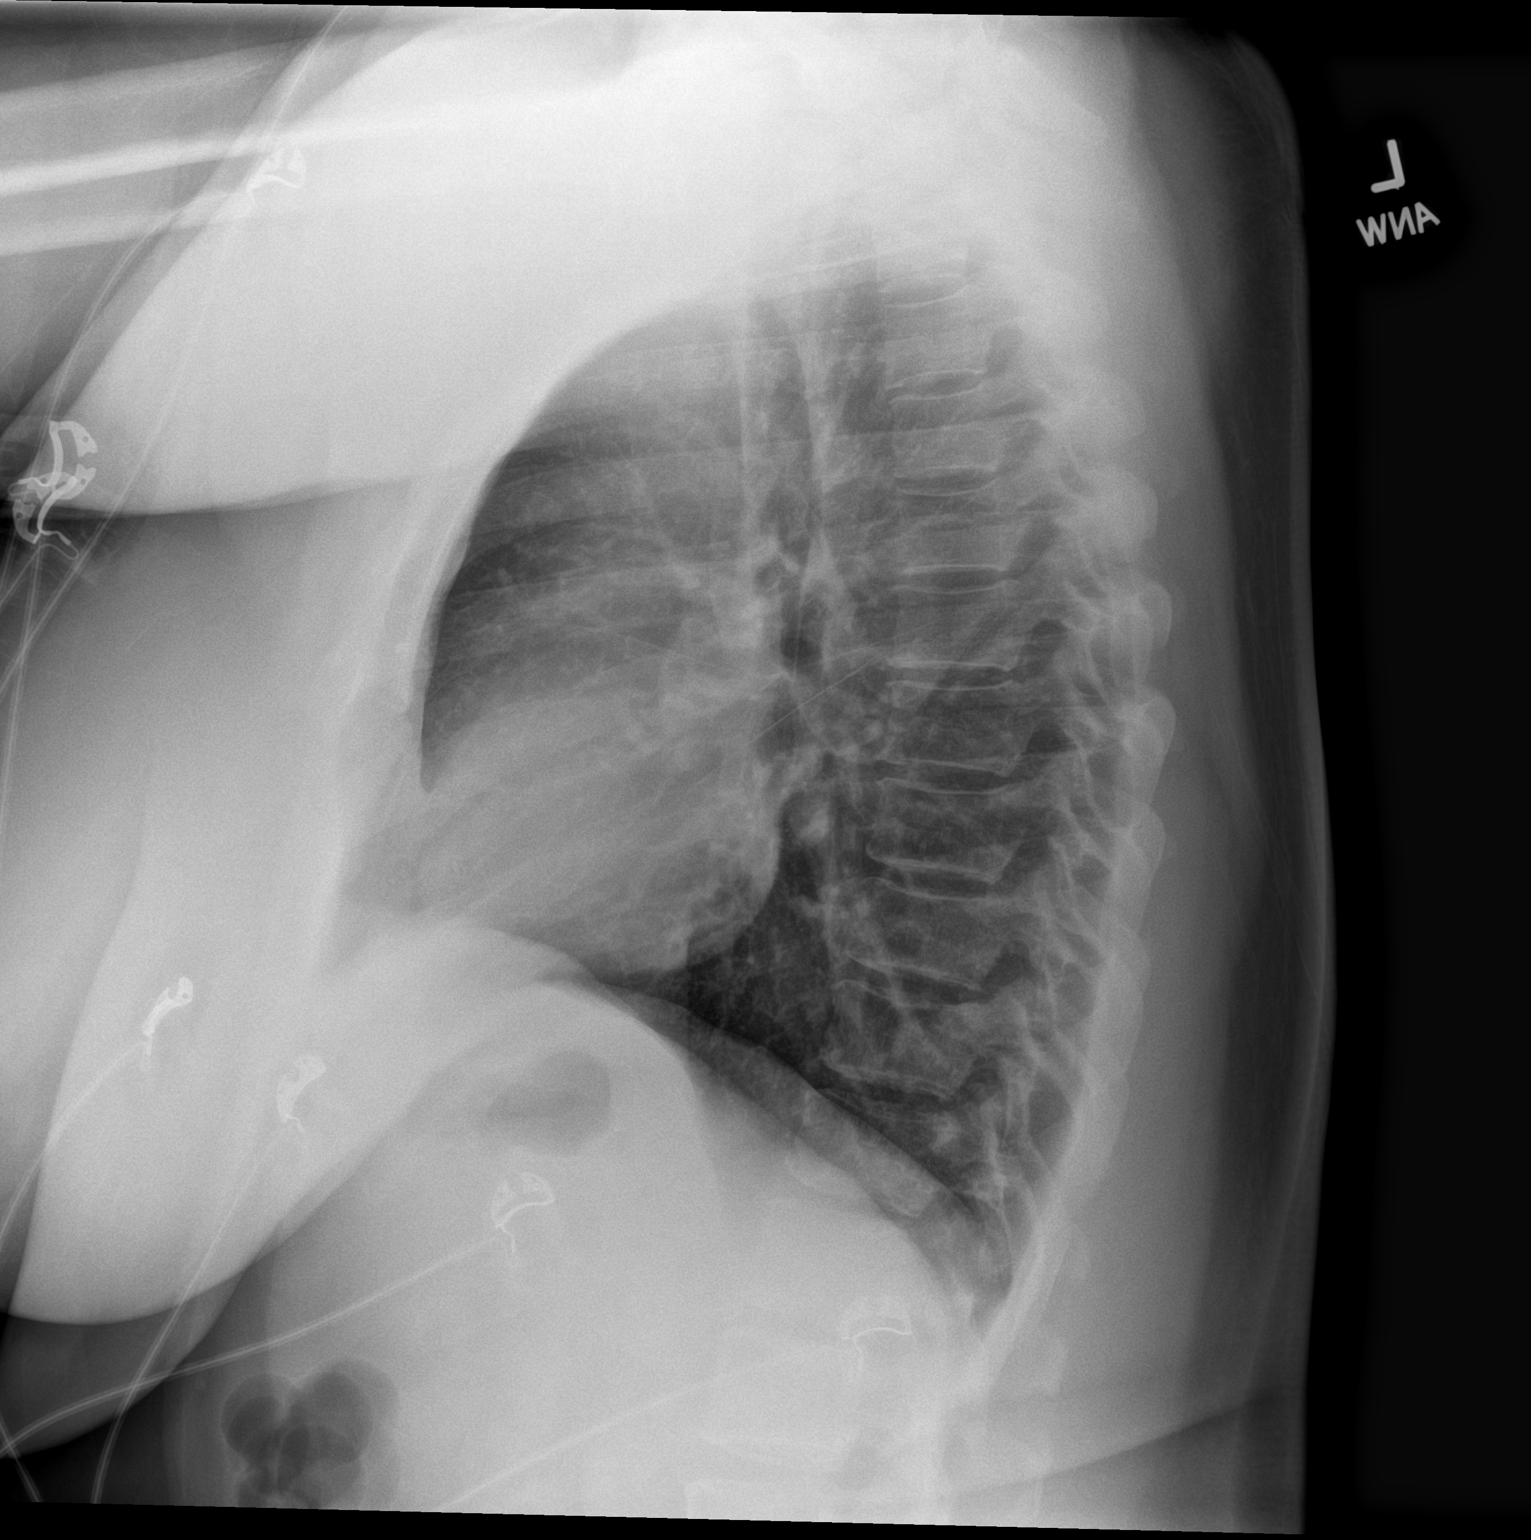

[2 of 2 positions shown; findings below may reference images not displayed]

FINDINGS: The heart size and mediastinal contours are within normal limits.
Both lungs are clear. The visualized skeletal structures are
unremarkable.
IMPRESSION: No active cardiopulmonary disease.

## 2024-02-13 ENCOUNTER — Other Ambulatory Visit (HOSPITAL_COMMUNITY): Payer: Self-pay

## 2024-03-31 ENCOUNTER — Other Ambulatory Visit (HOSPITAL_COMMUNITY): Payer: Self-pay

## 2024-06-07 DIAGNOSIS — E7800 Pure hypercholesterolemia, unspecified: Secondary | ICD-10-CM | POA: Diagnosis not present

## 2024-06-07 DIAGNOSIS — I10 Essential (primary) hypertension: Secondary | ICD-10-CM | POA: Diagnosis not present

## 2024-06-07 DIAGNOSIS — R7301 Impaired fasting glucose: Secondary | ICD-10-CM | POA: Diagnosis not present

## 2024-06-07 LAB — LAB REPORT - SCANNED
A1c: 6
EGFR: 107.2

## 2024-06-14 ENCOUNTER — Other Ambulatory Visit: Payer: Self-pay

## 2024-06-14 ENCOUNTER — Other Ambulatory Visit (HOSPITAL_COMMUNITY): Payer: Self-pay

## 2024-06-14 DIAGNOSIS — J302 Other seasonal allergic rhinitis: Secondary | ICD-10-CM | POA: Diagnosis not present

## 2024-06-14 DIAGNOSIS — M5431 Sciatica, right side: Secondary | ICD-10-CM | POA: Diagnosis not present

## 2024-06-14 DIAGNOSIS — R7301 Impaired fasting glucose: Secondary | ICD-10-CM | POA: Diagnosis not present

## 2024-06-14 DIAGNOSIS — M722 Plantar fascial fibromatosis: Secondary | ICD-10-CM | POA: Diagnosis not present

## 2024-06-14 DIAGNOSIS — E7800 Pure hypercholesterolemia, unspecified: Secondary | ICD-10-CM | POA: Diagnosis not present

## 2024-06-14 DIAGNOSIS — E669 Obesity, unspecified: Secondary | ICD-10-CM | POA: Diagnosis not present

## 2024-06-14 DIAGNOSIS — G5601 Carpal tunnel syndrome, right upper limb: Secondary | ICD-10-CM | POA: Diagnosis not present

## 2024-06-14 DIAGNOSIS — I10 Essential (primary) hypertension: Secondary | ICD-10-CM | POA: Diagnosis not present

## 2024-06-14 DIAGNOSIS — R82998 Other abnormal findings in urine: Secondary | ICD-10-CM | POA: Diagnosis not present

## 2024-06-14 DIAGNOSIS — Z0000 Encounter for general adult medical examination without abnormal findings: Secondary | ICD-10-CM | POA: Diagnosis not present

## 2024-06-14 DIAGNOSIS — R079 Chest pain, unspecified: Secondary | ICD-10-CM | POA: Diagnosis not present

## 2024-06-14 MED ORDER — HYDROCHLOROTHIAZIDE 25 MG PO TABS
25.0000 mg | ORAL_TABLET | Freq: Every day | ORAL | 3 refills | Status: AC
  Filled 2024-06-14: qty 90, 90d supply, fill #0
  Filled 2024-10-10: qty 90, 90d supply, fill #1
  Filled 2025-01-09: qty 90, 90d supply, fill #2

## 2024-06-14 MED ORDER — FLUTICASONE PROPIONATE 50 MCG/ACT NA SUSP
1.0000 | Freq: Two times a day (BID) | NASAL | 3 refills | Status: AC
  Filled 2024-06-14 – 2025-01-09 (×2): qty 48, 90d supply, fill #0
  Filled 2025-01-10: qty 16, 30d supply, fill #0

## 2024-06-17 ENCOUNTER — Other Ambulatory Visit: Payer: Self-pay

## 2024-06-18 ENCOUNTER — Other Ambulatory Visit: Payer: Self-pay | Admitting: *Deleted

## 2024-06-18 ENCOUNTER — Other Ambulatory Visit (HOSPITAL_COMMUNITY): Payer: Self-pay

## 2024-06-18 ENCOUNTER — Other Ambulatory Visit: Payer: Self-pay

## 2024-06-18 MED ORDER — NORETHIN ACE-ETH ESTRAD-FE 1-20 MG-MCG(24) PO TABS
1.0000 | ORAL_TABLET | Freq: Every day | ORAL | 0 refills | Status: DC
  Filled 2024-06-18: qty 84, 84d supply, fill #0

## 2024-06-18 NOTE — Telephone Encounter (Signed)
 Patient left message on triage line requesting refill of OCP to [facility].  Med refill request: Last AEX: 04/30/23-ML Next AEX: 07/27/24 -JC Last MMG (if hormonal med) 07/17/22-left breast distortion. Requested updated MMG from [facility].   Refill authorized: Please Advise?

## 2024-06-21 ENCOUNTER — Other Ambulatory Visit (HOSPITAL_COMMUNITY): Payer: Self-pay

## 2024-07-08 ENCOUNTER — Ambulatory Visit (INDEPENDENT_AMBULATORY_CARE_PROVIDER_SITE_OTHER): Admitting: Internal Medicine

## 2024-07-08 ENCOUNTER — Encounter (INDEPENDENT_AMBULATORY_CARE_PROVIDER_SITE_OTHER): Payer: Self-pay | Admitting: Internal Medicine

## 2024-07-08 VITALS — BP 129/88 | HR 95 | Temp 98.4°F | Ht 66.0 in | Wt 205.0 lb

## 2024-07-08 DIAGNOSIS — R7303 Prediabetes: Secondary | ICD-10-CM

## 2024-07-08 DIAGNOSIS — E66811 Obesity, class 1: Secondary | ICD-10-CM | POA: Diagnosis not present

## 2024-07-08 DIAGNOSIS — Z0289 Encounter for other administrative examinations: Secondary | ICD-10-CM

## 2024-07-08 DIAGNOSIS — Z6833 Body mass index (BMI) 33.0-33.9, adult: Secondary | ICD-10-CM

## 2024-07-08 DIAGNOSIS — I10 Essential (primary) hypertension: Secondary | ICD-10-CM | POA: Diagnosis not present

## 2024-07-08 DIAGNOSIS — E7800 Pure hypercholesterolemia, unspecified: Secondary | ICD-10-CM | POA: Diagnosis not present

## 2024-07-08 NOTE — Progress Notes (Signed)
 [address] [place], [place], [place] [address] Office: [phone]  /  Fax: [phone]   Initial Consultation    [name] was seen in clinic today to evaluate for obesity. She is interested in losing weight to improve overall health and reduce the risk of weight related complications. She presents today to review program treatment options, initial physical assessment, and evaluation.    Anthropometrics and Bioimpedance Analysis   Body mass index is 33.09 kg/m. Body Fat Mass : 41 % Visceral Fat Mass Rating : 10 Waist to Height Ratio: TBD  Obesity Related Diseases and Complications  Obesity Quality of Life and Psychosocial Complications: Decrease physical activity and social participation  Cardiometabolic: Prediabetes and/or insulin resistance, Dyslipidemia or hypercholesterolemia, and Hypertension  Biomechanical: Osteoarthritis of the knee or hip   Weight Related History  She was referred by: Friend or Family  When asked what they would like to accomplish? She states: Adopt a healthier eating pattern and lifestyle, Improve energy levels and physical activity, Improve existing medical conditions, and Improve quality of life  Weight history: Started to gain weight during perimenopause  Highest weight: 219  Contributing factors: family history of obesity, disruption of circadian rhythm / sleep disordered breathing, consumption of processed foods, moderate to high levels of stress, reduced physical activity, menopause, multiple weight loss attempts in the past, and sedentary job  Prior weight loss attempts: [employer], 187 lbs  Current or previous pharmacotherapy: None  Response to medication: Never tried medications  Current nutrition plan: None  Greatest challenge with dieting: cost.  Current level of physical activity: NEAT  Barriers to Exercise: time  Readiness and Motivation  On a scale from 0 to 10 How ready are you to make changes to your eating  and physical activity to lose weight? 10 How important is it for you to lose weight right now ? 10 How confident are you that you can lose weight if you try? 9  Past Medical History   Past Medical History:  Diagnosis Date   Carpal tunnel syndrome of left wrist    Hypertension      Objective    BP 129/88   Pulse 95   Temp 98.4 F (36.9 C)   Ht 5' 6 (1.676 m)   Wt 205 lb (93 kg)   LMP 06/23/2024   SpO2 98%   BMI 33.09 kg/m  She was weighed on the bioimpedance scale: Body mass index is 33.09 kg/m.    General:  Alert, oriented and cooperative. Patient is in no acute distress.  Respiratory: Normal respiratory effort, no problems with respiration noted   Gait: able to ambulate independently  Mental Status: Normal mood and affect. Normal behavior. Normal judgment and thought content.   Diagnostic Data Reviewed  BMET    Component Value Date/Time   NA 135 01/31/2022 0833   K 4.0 01/31/2022 0833   CL 100 01/31/2022 0833   CO2 24 01/31/2022 0833   GLUCOSE 152 (H) 01/31/2022 0833   BUN 10 01/31/2022 0833   CREATININE 0.76 01/31/2022 0833   CALCIUM 9.7 01/31/2022 0833   GFRNONAA >60 01/31/2022 0833   GFRAA  01/05/2010 0740    >60        The eGFR has been calculated using the MDRD equation. This calculation has not been validated in all clinical situations. eGFR's persistently <60 mL/min signify possible Chronic Kidney Disease.   No results found for: HGBA1C No results found for: INSULIN CBC    Component Value Date/Time  WBC 9.4 01/31/2022 0833   RBC 5.08 01/31/2022 0833   HGB 14.3 01/31/2022 0833   HCT 42.3 01/31/2022 0833   PLT 277 01/31/2022 0833   MCV 83.3 01/31/2022 0833   MCH 28.1 01/31/2022 0833   MCHC 33.8 01/31/2022 0833   RDW 13.1 01/31/2022 0833   Iron/TIBC/Ferritin/ %Sat No results found for: IRON, TIBC, FERRITIN, IRONPCTSAT Lipid Panel  No results found for: CHOL, TRIG, HDL, CHOLHDL, VLDL, LDLCALC,  LDLDIRECT Hepatic Function Panel     Component Value Date/Time   PROT 5.9 (L) 12/07/2007 0505   ALBUMIN 3.0 (L) 12/07/2007 0505   AST 24 12/07/2007 0505   ALT 34 12/07/2007 0505   ALKPHOS 128 (H) 12/07/2007 0505   BILITOT 0.5 12/07/2007 0505   No results found for: TSH  Medications  Outpatient Encounter Medications as of 07/08/2024  Medication Sig   fluticasone  (FLONASE ) 50 MCG/ACT nasal spray Place 1 spray into both nostrils 2 (two) times daily.   hydrochlorothiazide  (HYDRODIURIL ) 25 MG tablet Take 1 tablet (25 mg total) by mouth daily.   hydrochlorothiazide  (HYDRODIURIL ) 25 MG tablet Take 1 tablet (25 mg total) by mouth daily.   ibuprofen  (ADVIL ) 600 MG tablet Take 1 tablet (600 mg total) by mouth every 6 (six) hours as needed for mild pain.   Norethindrone  Acetate-Ethinyl Estrad-FE (LOESTRIN  24 FE) 1-20 MG-MCG(24) tablet Take 1 tablet by mouth daily.   [DISCONTINUED] cephALEXin  (KEFLEX ) 500 MG capsule Take 1 capsule (500 mg total) by mouth 4 (four) times daily as directed for 7 days   [DISCONTINUED] fluticasone  (FLONASE ) 50 MCG/ACT nasal spray Place 1 spray into both nostrils 2 (two) times daily.   [DISCONTINUED] fluticasone  (FLONASE ) 50 MCG/ACT nasal spray Place 1 spray into both nostrils 2 (two) times daily.   [DISCONTINUED] ondansetron  (ZOFRAN -ODT) 4 MG disintegrating tablet Dissolve 2 tablets (4 mg total) in mouth every 8 (eight) hours as needed for 2 days   [DISCONTINUED] oxyCODONE  (OXY IR/ROXICODONE ) 5 MG immediate release tablet Take 1 tablet (5 mg total) by mouth every 4-6 hours as needed for pain for 7 days   No facility-administered encounter medications on file as of 07/08/2024.     Assessment and Plan   Prediabetes Assessment & Plan: Based on history.  She has had an elevated fasting blood glucose.  Review of outside records showed an A1c of 5.7.  No history of gestational diabetes there is family history of diabetes.  She is at risk for progression.  Losing 7 to  10% of body weight may improve condition.  We will assess glycemic control with intake labs she would benefit from a low-carb moderate to high-protein meal plan.  This will be tailored to resting energy expenditure based on indirect calorimetry.   Primary hypertension Assessment & Plan: Blood pressure slightly above goal of less than 120/80 she is on hydrochlorothiazide .  She has not been monitoring blood pressure.  Losing 10% of body weight may improve blood pressure control we will assess renal parameters and cardiometabolic risk over the next 2 visits.   Class 1 obesity with serious comorbidity and body mass index (BMI) of 33.0 to 33.9 in adult, unspecified obesity type Assessment & Plan: Obesity Related Diseases and Complications  Obesity Quality of Life and Psychosocial Complications: Decrease physical activity and social participation  Cardiometabolic: Prediabetes and/or insulin resistance, Dyslipidemia or hypercholesterolemia, and Hypertension  Biomechanical: Osteoarthritis of the knee or hip   Contributing factors:family history of obesity, disruption of circadian rhythm / sleep disordered breathing, consumption of processed foods,  moderate to high levels of stress, reduced physical activity, menopause, multiple weight loss attempts in the past, and sedentary job  We reviewed anthropometrics, biometrics, associated medical conditions and contributing factors with patient. she would benefit from a medically tailored reduced calorie nutrional plan based on her REE (resting energy expenditure), which will be determined by indirect calorimetry.  We will also assess for cardiometabolic risk and nutritional derangements via fasting labs at intake appointment.     Pure hypercholesterolemia Assessment & Plan: Based on review of external records unfortunately I cannot see labs done at [facility] but is documented that she has hypercholesterolemia.  We will check a fasting  lipid profile with intake labs and assess cardiometabolic risk.        Obesity Treatment and Action Plan:  Patient will work on garnering support from family and friends to begin weight loss journey. Will work on eliminating or reducing the presence of highly palatable, calorie dense foods in the home. Will complete provided nutritional and psychosocial assessment questionnaire before the next appointment. Will be scheduled for indirect calorimetry to determine resting energy expenditure in a fasting state.  This will allow us  to create a reduced calorie, high-protein meal plan to promote loss of fat mass while preserving muscle mass. Counseled on the health benefits of losing 5%-15% of total body weight. Was counseled on nutritional approaches to weight loss and benefits of reducing processed foods and consuming plant-based foods and high quality protein as part of nutritional weight management. Was counseled on pharmacotherapy and role as an adjunct in weight management.   Education and Additional resources  She was weighed on the bioimpedance scale and results were discussed and documented in the synopsis.  We discussed obesity as a progressive, chronic disease and the importance of a more detailed evaluation of all the factors contributing to the disease.  We reviewed the basic principles in obesity management.   We discussed the importance of long term lifestyle changes which include nutrition, exercise and behavioral modification as well as the importance of customizing this to her specific health and social needs.  We reviewed the role of medical interventions including pharmacotherapy and surgical interventions.   We discussed the benefits of reaching a healthier weight to alleviate the symptoms of existing conditions and reduce the risks of the biomechanical, cardiometabolic and psychological effects of obesity.  We reviewed our program approach and philosophy, which are guided  by the four pillars of obesity medicine.  We discussed how to prepare for intake appointment and the importance of fasting and avoidance of stimulants for at least 8 hours prior to indirect calorimetry.  Apryll K Graves-Bigelow appears to be in the action stage of change and reports being ready to initiate intensive lifestyle and behavioral modifications as part of their weight loss journey.  Attestation  Reviewed by clinician on day of visit: allergies, medications, problem list, medical history, surgical history, family history, social history, and previous encounter notes pertinent to obesity diagnosis.  I have spent 45 minutes in the care of the patient today including: 2 minutes before the visit reviewing and preparing the chart. 35 minutes face-to-face assessing and reviewing listed medical problems as outlined in obesity care plan, providing nutritional and behavioral counseling on topics outlined in the obesity care plan, independently interpreting test results and goals of care, as described in assessment and plan, reviewing and discussing biometric information and progress, and review of outside records in Care Everywhere 8 minutes after the visit updating chart and  documentation of encounter.   Lucas Parker, MD, ABIM, [place]

## 2024-07-09 DIAGNOSIS — I10 Essential (primary) hypertension: Secondary | ICD-10-CM | POA: Insufficient documentation

## 2024-07-09 DIAGNOSIS — R7303 Prediabetes: Secondary | ICD-10-CM | POA: Insufficient documentation

## 2024-07-09 DIAGNOSIS — E7800 Pure hypercholesterolemia, unspecified: Secondary | ICD-10-CM | POA: Insufficient documentation

## 2024-07-09 DIAGNOSIS — E66811 Obesity, class 1: Secondary | ICD-10-CM | POA: Insufficient documentation

## 2024-07-09 NOTE — Assessment & Plan Note (Signed)
 Blood pressure slightly above goal of less than 120/80 she is on hydrochlorothiazide .  She has not been monitoring blood pressure.  Losing 10% of body weight may improve blood pressure control we will assess renal parameters and cardiometabolic risk over the next 2 visits.

## 2024-07-09 NOTE — Assessment & Plan Note (Signed)
 Based on history.  She has had an elevated fasting blood glucose.  Review of outside records showed an A1c of 5.7.  No history of gestational diabetes there is family history of diabetes.  She is at risk for progression.  Losing 7 to 10% of body weight may improve condition.  We will assess glycemic control with intake labs she would benefit from a low-carb moderate to high-protein meal plan.  This will be tailored to resting energy expenditure based on indirect calorimetry.

## 2024-07-09 NOTE — Assessment & Plan Note (Signed)
 Based on review of external records unfortunately I cannot see labs done at [facility] but is documented that she has hypercholesterolemia.  We will check a fasting lipid profile with intake labs and assess cardiometabolic risk.

## 2024-07-09 NOTE — Assessment & Plan Note (Addendum)
 Obesity Related Diseases and Complications  Obesity Quality of Life and Psychosocial Complications: Decrease physical activity and social participation  Cardiometabolic: Prediabetes and/or insulin resistance, Dyslipidemia or hypercholesterolemia, and Hypertension  Biomechanical: Osteoarthritis of the knee or hip   Contributing factors:family history of obesity, disruption of circadian rhythm / sleep disordered breathing, consumption of processed foods, moderate to high levels of stress, reduced physical activity, menopause, multiple weight loss attempts in the past, and sedentary job  We reviewed anthropometrics, biometrics, associated medical conditions and contributing factors with patient. she would benefit from a medically tailored reduced calorie nutrional plan based on her REE (resting energy expenditure), which will be determined by indirect calorimetry.  We will also assess for cardiometabolic risk and nutritional derangements via fasting labs at intake appointment.

## 2024-07-14 ENCOUNTER — Emergency Department (HOSPITAL_BASED_OUTPATIENT_CLINIC_OR_DEPARTMENT_OTHER): Admitting: Radiology

## 2024-07-14 ENCOUNTER — Other Ambulatory Visit: Payer: Self-pay

## 2024-07-14 ENCOUNTER — Encounter (HOSPITAL_BASED_OUTPATIENT_CLINIC_OR_DEPARTMENT_OTHER): Payer: Self-pay | Admitting: Emergency Medicine

## 2024-07-14 ENCOUNTER — Emergency Department (HOSPITAL_BASED_OUTPATIENT_CLINIC_OR_DEPARTMENT_OTHER)
Admission: EM | Admit: 2024-07-14 | Discharge: 2024-07-14 | Disposition: A | Discharge status: Home / Self Care | Attending: Emergency Medicine | Admitting: Emergency Medicine

## 2024-07-14 DIAGNOSIS — R079 Chest pain, unspecified: Secondary | ICD-10-CM

## 2024-07-14 DIAGNOSIS — Z79899 Other long term (current) drug therapy: Secondary | ICD-10-CM | POA: Insufficient documentation

## 2024-07-14 DIAGNOSIS — R0789 Other chest pain: Secondary | ICD-10-CM | POA: Insufficient documentation

## 2024-07-14 DIAGNOSIS — I10 Essential (primary) hypertension: Secondary | ICD-10-CM | POA: Diagnosis not present

## 2024-07-14 LAB — BASIC METABOLIC PANEL WITH GFR
Anion gap: 14 (ref 5–15)
BUN: 10 mg/dL (ref 6–20)
CO2: 22 mmol/L (ref 22–32)
Calcium: 10.2 mg/dL (ref 8.9–10.3)
Chloride: 101 mmol/L (ref 98–111)
Creatinine, Ser: 0.94 mg/dL (ref 0.44–1.00)
GFR, Estimated: >60 mL/min (ref >60)
Glucose, Bld: 109 mg/dL — ABNORMAL HIGH (ref 70–99)
Potassium: 3.5 mmol/L (ref 3.5–5.1)
Sodium: 137 mmol/L (ref 135–145)

## 2024-07-14 LAB — CBC
HCT: 41.6 % (ref 36.0–46.0)
Hemoglobin: 14.2 g/dL (ref 12.0–15.0)
MCH: 28.5 pg (ref 26.0–34.0)
MCHC: 34.1 g/dL (ref 30.0–36.0)
MCV: 83.4 fL (ref 80.0–100.0)
Platelets: 312 K/uL (ref 150–400)
RBC: 4.99 MIL/uL (ref 3.87–5.11)
RDW: 13.2 % (ref 11.5–15.5)
WBC: 10 K/uL (ref 4.0–10.5)
nRBC: 0 % (ref 0.0–0.2)

## 2024-07-14 LAB — TROPONIN T, HIGH SENSITIVITY: Troponin T High Sensitivity: <15 ng/L (ref <19)

## 2024-07-14 LAB — D-DIMER, QUANTITATIVE: D-Dimer, Quant: <0.27 ug{FEU}/mL (ref 0.00–0.50)

## 2024-07-14 NOTE — ED Provider Notes (Signed)
 [place] EMERGENCY DEPARTMENT AT [facility] Provider Note   CSN: [id number] Arrival date & time: 07/14/24  1706     Patient presents with: Chest Pain   [name] is a 49 y.o. female.   48 yo F with a chief complaint of chest pain.  This has been off and on for months.  She thinks worse over the past couple weeks or so.  She has had a dry cough and so was worried that maybe this was indigestion.  Pain has been mostly in her abdomen underneath her left breast.  She thought it may be gas and started taking MiraLAX.  She has an appointment for cardiology in October.  She denies any exertional symptoms.  Denies trauma.  Patient denies history of MI, denies hyperlipidemia diabetes or smoking.  Denies family history of MI.  Has a history of hypertension.  Patient denies history of PE or DVT denies hemoptysis denies unilateral lower extremity edema denies recent surgery immobilization hospitalization estrogen use or history of cancer.     Chest Pain      Prior to Admission medications   Medication Sig Start Date End Date Taking? Authorizing Provider  fluticasone  (FLONASE ) 50 MCG/ACT nasal spray Place 1 spray into both nostrils 2 (two) times daily. 06/14/24     hydrochlorothiazide  (HYDRODIURIL ) 25 MG tablet Take 1 tablet (25 mg total) by mouth daily. 06/09/23     hydrochlorothiazide  (HYDRODIURIL ) 25 MG tablet Take 1 tablet (25 mg total) by mouth daily. 06/14/24     ibuprofen  (ADVIL ) 600 MG tablet Take 1 tablet (600 mg total) by mouth every 6 (six) hours as needed for mild pain. 01/31/22   Elnor Bernarda SQUIBB, DO  Norethindrone  Acetate-Ethinyl Estrad-FE (LOESTRIN  24 FE) 1-20 MG-MCG(24) tablet Take 1 tablet by mouth daily. 06/18/24   Chrzanowski, Jami B, NP    Allergies: Patient has no active allergies.    Review of Systems  Cardiovascular:  Positive for chest pain.    Updated Vital Signs BP (!) 143/91   Pulse 89   Resp 12   Ht 5' 6 (1.676 m)   Wt 93 kg   LMP  06/23/2024 (Approximate)   SpO2 100%   BMI 33.09 kg/m   Physical Exam Vitals and nursing note reviewed.  Constitutional:      General: She is not in acute distress.    Appearance: She is well-developed. She is not diaphoretic.  HENT:     Head: Normocephalic and atraumatic.  Eyes:     Pupils: Pupils are equal, round, and reactive to light.  Cardiovascular:     Rate and Rhythm: Normal rate and regular rhythm.     Heart sounds: No murmur heard.    No friction rub. No gallop.  Pulmonary:     Effort: Pulmonary effort is normal.     Breath sounds: No wheezing or rales.  Chest:     Chest wall: Tenderness present.     Comments: Tenderness about the left costal margin reproduces her discomfort.  No obvious abdominal discomfort.  No rash. Abdominal:     General: There is no distension.     Palpations: Abdomen is soft.     Tenderness: There is no abdominal tenderness.  Musculoskeletal:        General: No tenderness.     Cervical back: Normal range of motion and neck supple.  Skin:    General: Skin is warm and dry.  Neurological:     Mental Status: She is alert and  oriented to person, place, and time.  Psychiatric:        Behavior: Behavior normal.     (all labs ordered are listed, but only abnormal results are displayed) Labs Reviewed  BASIC METABOLIC PANEL WITH GFR - Abnormal; Notable for the following components:      Result Value   Glucose, Bld 109 (*)    All other components within normal limits  CBC  D-DIMER, QUANTITATIVE  PREGNANCY, URINE  TROPONIN T, HIGH SENSITIVITY    EKG: EKG Interpretation Date/Time:  Wednesday July 14 2024 17:15:45 EDT Ventricular Rate:  103 PR Interval:  143 QRS Duration:  74 QT Interval:  324 QTC Calculation: 425 R Axis:   59  Text Interpretation: Sinus tachycardia Borderline repolarization abnormality No significant change since last tracing Confirmed by Emil Share [phone]) on 07/14/2024 5:36:24 PM  Radiology: [facility] Chest Port 1  View Result Date: 07/14/2024 CLINICAL DATA:  Chest pain EXAM: PORTABLE CHEST 1 VIEW COMPARISON:  01/31/2022 FINDINGS: The heart size and mediastinal contours are within normal limits. Both lungs are clear. The visualized skeletal structures are unremarkable. IMPRESSION: No active disease. Electronically Signed   By: Luke Bun M.D.   On: 07/14/2024 17:41     Procedures   Medications Ordered in the ED - No data to display                                  Medical Decision Making Amount and/or Complexity of Data Reviewed Labs: ordered. Radiology: ordered.   48 yo F with a chief complaints of left-sided chest discomfort.  Atypical in nature and reproduced on exam.  Going on for months.  Worsening over the past couple weeks.  Will obtain a single troponin.  Chest x-ray.  Blood work.  EKG nonischemic on my independent to rotation.  D-dimer negative.  Troponin negative.  No leukocytosis, no acute anemia.  No significant electrolyte abnormalities.  Chest x-ray independently interpreted by me without focal infiltrate or pneumothorax.  6:28 PM:  I have discussed the diagnosis/risks/treatment options with the patient.  Evaluation and diagnostic testing in the emergency department does not suggest an emergent condition requiring admission or immediate intervention beyond what has been performed at this time.  They will follow up with PCP. We also discussed returning to the ED immediately if new or worsening sx occur. We discussed the sx which are most concerning (e.g., sudden worsening pain, fever, inability to tolerate by mouth) that necessitate immediate return. Medications administered to the patient during their visit and any new prescriptions provided to the patient are listed below.  Medications given during this visit Medications - No data to display   The patient appears reasonably screen and/or stabilized for discharge and I doubt any other medical condition or other [facility] requiring  further screening, evaluation, or treatment in the ED at this time prior to discharge.       Final diagnoses:  Nonspecific chest pain    ED Discharge Orders          Ordered    Ambulatory referral to Cardiology       Comments: If you have not heard from the Cardiology office within the next 72 hours please call [phone].   07/14/24 1814               Emil Share, DO 07/14/24 1828

## 2024-07-14 NOTE — Discharge Instructions (Signed)
 Please follow-up with your family doctor and cardiologist in the office.  Please return for worsening pain especially upon exertion if you cough up blood or if you pass out or feel like you are going to pass out.  Take 4 over the counter ibuprofen  tablets 3 times a day or 2 over-the-counter naproxen tablets twice a day for pain. Also take tylenol 1000mg (2 extra strength) four times a day.

## 2024-07-14 NOTE — ED Notes (Signed)
 DC paperwork given and verbally understood.

## 2024-07-14 NOTE — ED Triage Notes (Signed)
 Pt via pov from home with intermittent chest pain since mid June. She also reports that she has had some LUQ abdominal pain in the last few days. Pt reports that her bowel movements have been a bit unusual. Pt reports that her primary care provider referred her to cardiology and she has an appointment in October. Pt also reports dry cough. A&o x 4; nad noted.

## 2024-07-23 DIAGNOSIS — Z1231 Encounter for screening mammogram for malignant neoplasm of breast: Secondary | ICD-10-CM | POA: Diagnosis not present

## 2024-07-27 ENCOUNTER — Encounter: Payer: Self-pay | Admitting: Radiology

## 2024-07-27 ENCOUNTER — Ambulatory Visit (INDEPENDENT_AMBULATORY_CARE_PROVIDER_SITE_OTHER): Admitting: Radiology

## 2024-07-27 ENCOUNTER — Other Ambulatory Visit (HOSPITAL_COMMUNITY)
Admission: RE | Admit: 2024-07-27 | Discharge: 2024-07-27 | Disposition: A | Discharge status: Home / Self Care | Source: Ambulatory Visit | Attending: Radiology | Admitting: Radiology

## 2024-07-27 VITALS — BP 130/80 | HR 105 | Ht 67.5 in | Wt 208.2 lb

## 2024-07-27 DIAGNOSIS — E6609 Other obesity due to excess calories: Secondary | ICD-10-CM

## 2024-07-27 DIAGNOSIS — K219 Gastro-esophageal reflux disease without esophagitis: Secondary | ICD-10-CM | POA: Diagnosis not present

## 2024-07-27 DIAGNOSIS — Z6833 Body mass index (BMI) 33.0-33.9, adult: Secondary | ICD-10-CM

## 2024-07-27 DIAGNOSIS — Z01419 Encounter for gynecological examination (general) (routine) without abnormal findings: Secondary | ICD-10-CM | POA: Insufficient documentation

## 2024-07-27 DIAGNOSIS — E66811 Obesity, class 1: Secondary | ICD-10-CM | POA: Diagnosis not present

## 2024-07-27 DIAGNOSIS — Z1331 Encounter for screening for depression: Secondary | ICD-10-CM | POA: Diagnosis not present

## 2024-07-27 NOTE — Progress Notes (Signed)
 [name] [birth year] [id number]   History:  48 y.o. G4P1 presents for annual exam. C/o reflux with dry cough, prilosex offers no relief. Having hot flashes, joint pain, brian fog, trouble sleeping. Currently on OCPs. Unsure if she wants to continue or switch to HRT. Has 2 packs of OCPs left. Interested in breast reduction, chronic back pain. Bra size currently an I cup. Has appt with healthy weight and wellness upcoming. Has follow up breast u/s next week for density seen on mammogram at [facility].  Gynecologic History Patient's last menstrual period was 07/24/2024 (approximate). Period Cycle (Days): 28 Period Duration (Days): 2 Period Pattern: Regular Menstrual Flow: Light Menstrual Control: Maxi pad Menstrual Control Change Freq (Hours): 2 Dysmenorrhea: (!) Mild Dysmenorrhea Symptoms: Cramping Contraception/Family planning: OCP (estrogen/progesterone) Sexually active: yes Last Pap: 2022. Results were: normal Last mammogram: 2025. Results were: abnormal left, has f/u scheduled  Obstetric History OB History  Gravida Para Term Preterm AB Living  4 1 1  3 1   SAB IAB Ectopic Multiple Live Births  3    1    # Outcome Date GA Lbr Len/2nd Weight Sex Type Anes PTL Lv  4 SAB           3 SAB           2 SAB           1 Term                07/27/2024    4:09 PM  Depression screen PHQ 2/9  Decreased Interest 0  Down, Depressed, Hopeless 0  PHQ - 2 Score 0     The following portions of the patient's history were reviewed and updated as appropriate: allergies, current medications, past family history, past medical history, past social history, past surgical history, and problem list.  Review of Systems  All other systems reviewed and are negative.   Past medical history, past surgical history, family history and social history were all reviewed and documented in the EPIC chart.  Exam:  Vitals:   07/27/24 1602  BP: 130/80  Pulse: (!) 105  SpO2: 98%  Weight: 208  lb 3.2 oz (94.4 kg)  Height: 5' 7.5 (1.715 m)   Body mass index is 32.13 kg/m.  Physical Exam Vitals and nursing note reviewed. Exam conducted with a chaperone present.  Constitutional:      Appearance: Normal appearance. She is normal weight.  HENT:     Head: Normocephalic and atraumatic.  Neck:     Thyroid: No thyroid mass, thyromegaly or thyroid tenderness.  Cardiovascular:     Rate and Rhythm: Regular rhythm.     Heart sounds: Normal heart sounds.  Pulmonary:     Effort: Pulmonary effort is normal.     Breath sounds: Normal breath sounds.  Chest:  Breasts:    Breasts are symmetrical.     Right: Normal. No inverted nipple, mass, nipple discharge, skin change or tenderness.     Left: Normal. No inverted nipple, mass, nipple discharge, skin change or tenderness.     Comments: Large pendulous breasts effecting posture Abdominal:     General: Abdomen is flat. Bowel sounds are normal.     Palpations: Abdomen is soft.  Genitourinary:    General: Normal vulva.     Vagina: Normal. No vaginal discharge, bleeding or lesions.     Cervix: Normal. No discharge or lesion.     Uterus: Normal. Not enlarged and not tender.  Adnexa: Right adnexa normal and left adnexa normal.       Right: No mass, tenderness or fullness.         Left: No mass, tenderness or fullness.    Lymphadenopathy:     Upper Body:     Right upper body: No axillary adenopathy.     Left upper body: No axillary adenopathy.  Skin:    General: Skin is warm and dry.  Neurological:     Mental Status: She is alert and oriented to person, place, and time.  Psychiatric:        Mood and Affect: Mood normal.        Thought Content: Thought content normal.        Judgment: Judgment normal.      Darice Hoit, CMA present for exam  Assessment/Plan:   1. Well woman exam with routine gynecological exam (Primary) - Cytology - PAP( [place]) - Will decide on OCPs vs HRT before she runs out of OCPs, aware she will  need back up birth control. Condoms, IUD or phexxi are good options 2. Depression screening negative  3. Gastroesophageal reflux disease without esophagitis Nexium OTC BID x 14 days, if no improvement will refer to GI  4. Class 1 obesity due to excess calories with serious comorbidity and body mass index (BMI) of 33.0 to 33.9 in adult Consult scheduled    Return in about 1 year (around 07/27/2025) for Annual.  Flay Ghosh B WHNP-BC 4:12 PM 07/27/2024

## 2024-07-27 NOTE — Patient Instructions (Signed)
 Preventive Care 16-50 Years Old, Female  Preventive care refers to lifestyle choices and visits with your health care provider that can promote health and wellness. Preventive care visits are also called wellness exams.  What can I expect for my preventive care visit?  Counseling  Your health care provider may ask you questions about your:  Medical history, including:  Past medical problems.  Family medical history.  Pregnancy history.  Current health, including:  Menstrual cycle.  Method of birth control.  Emotional well-being.  Home life and relationship well-being.  Sexual activity and sexual health.  Lifestyle, including:  Alcohol, nicotine or tobacco, and drug use.  Access to firearms.  Diet, exercise, and sleep habits.  Work and work Astronomer.  Sunscreen use.  Safety issues such as seatbelt and bike helmet use.  Physical exam  Your health care provider will check your:  Height and weight. These may be used to calculate your BMI (body mass index). BMI is a measurement that tells if you are at a healthy weight.  Waist circumference. This measures the distance around your waistline. This measurement also tells if you are at a healthy weight and may help predict your risk of certain diseases, such as type 2 diabetes and high blood pressure.  Heart rate and blood pressure.  Body temperature.  Skin for abnormal spots.  What immunizations do I need?    Vaccines are usually given at various ages, according to a schedule. Your health care provider will recommend vaccines for you based on your age, medical history, and lifestyle or other factors, such as travel or where you work.  What tests do I need?  Screening  Your health care provider may recommend screening tests for certain conditions. This may include:  Lipid and cholesterol levels.  Diabetes screening. This is done by checking your blood sugar (glucose) after you have not eaten for a while (fasting).  Pelvic exam and Pap test.  Hepatitis B test.  Hepatitis C  test.  HIV (human immunodeficiency virus) test.  STI (sexually transmitted infection) testing, if you are at risk.  Lung cancer screening.  Colorectal cancer screening.  Mammogram. Talk with your health care provider about when you should start having regular mammograms. This may depend on whether you have a family history of breast cancer.  BRCA-related cancer screening. This may be done if you have a family history of breast, ovarian, tubal, or peritoneal cancers.  Bone density scan. This is done to screen for osteoporosis.  Talk with your health care provider about your test results, treatment options, and if necessary, the need for more tests.  Follow these instructions at home:  Eating and drinking    Eat a diet that includes fresh fruits and vegetables, whole grains, lean protein, and low-fat dairy products.  Take vitamin and mineral supplements as recommended by your health care provider.  Do not drink alcohol if:  Your health care provider tells you not to drink.  You are pregnant, may be pregnant, or are planning to become pregnant.  If you drink alcohol:  Limit how much you have to 0-1 drink a day.  Know how much alcohol is in your drink. In the [place] one drink equals one 12 oz bottle of beer (355 mL), one 5 oz glass of wine (148 mL), or one 1 oz glass of hard liquor (44 mL).  Lifestyle  Brush your teeth every morning and night with fluoride toothpaste. Floss one time each day.  Exercise for at least  30 minutes 5 or more days each week.  Do not use any products that contain nicotine or tobacco. These products include cigarettes, chewing tobacco, and vaping devices, such as e-cigarettes. If you need help quitting, ask your health care provider.  Do not use drugs.  If you are sexually active, practice safe sex. Use a condom or other form of protection to prevent STIs.  If you do not wish to become pregnant, use a form of birth control. If you plan to become pregnant, see your health care provider for a  prepregnancy visit.  Take aspirin only as told by your health care provider. Make sure that you understand how much to take and what form to take. Work with your health care provider to find out whether it is safe and beneficial for you to take aspirin daily.  Find healthy ways to manage stress, such as:  Meditation, yoga, or listening to music.  Journaling.  Talking to a trusted person.  Spending time with friends and family.  Minimize exposure to UV radiation to reduce your risk of skin cancer.  Safety  Always wear your seat belt while driving or riding in a vehicle.  Do not drive:  If you have been drinking alcohol. Do not ride with someone who has been drinking.  When you are tired or distracted.  While texting.  If you have been using any mind-altering substances or drugs.  Wear a helmet and other protective equipment during sports activities.  If you have firearms in your house, make sure you follow all gun safety procedures.  Seek help if you have been physically or sexually abused.  What's next?  Visit your health care provider once a year for an annual wellness visit.  Ask your health care provider how often you should have your eyes and teeth checked.  Stay up to date on all vaccines.  This information is not intended to replace advice given to you by your health care provider. Make sure you discuss any questions you have with your health care provider.  Document Revised: 06/13/2021 Document Reviewed: 06/13/2021  Elsevier Patient Education  2024 [employer].

## 2024-08-01 LAB — CYTOLOGY - PAP
Adequacy: ABSENT
Comment: NEGATIVE
Diagnosis: NEGATIVE
High risk HPV: NEGATIVE

## 2024-08-02 ENCOUNTER — Ambulatory Visit: Payer: Self-pay | Admitting: Radiology

## 2024-08-04 DIAGNOSIS — N6321 Unspecified lump in the left breast, upper outer quadrant: Secondary | ICD-10-CM | POA: Diagnosis not present

## 2024-08-04 DIAGNOSIS — R928 Other abnormal and inconclusive findings on diagnostic imaging of breast: Secondary | ICD-10-CM | POA: Diagnosis not present

## 2024-08-10 ENCOUNTER — Other Ambulatory Visit: Payer: Self-pay

## 2024-08-10 DIAGNOSIS — N6012 Diffuse cystic mastopathy of left breast: Secondary | ICD-10-CM | POA: Diagnosis not present

## 2024-08-10 DIAGNOSIS — N6321 Unspecified lump in the left breast, upper outer quadrant: Secondary | ICD-10-CM | POA: Diagnosis not present

## 2024-08-11 LAB — SURGICAL PATHOLOGY

## 2024-08-18 ENCOUNTER — Encounter (INDEPENDENT_AMBULATORY_CARE_PROVIDER_SITE_OTHER): Payer: Self-pay

## 2024-08-24 ENCOUNTER — Institutional Professional Consult (permissible substitution) (INDEPENDENT_AMBULATORY_CARE_PROVIDER_SITE_OTHER): Admitting: Internal Medicine

## 2024-08-25 ENCOUNTER — Encounter (INDEPENDENT_AMBULATORY_CARE_PROVIDER_SITE_OTHER): Payer: Self-pay | Admitting: Internal Medicine

## 2024-08-25 ENCOUNTER — Ambulatory Visit (INDEPENDENT_AMBULATORY_CARE_PROVIDER_SITE_OTHER): Admitting: Internal Medicine

## 2024-08-25 VITALS — BP 135/84 | HR 80 | Temp 98.7°F | Ht 66.5 in | Wt 205.0 lb

## 2024-08-25 DIAGNOSIS — E66811 Obesity, class 1: Secondary | ICD-10-CM | POA: Diagnosis not present

## 2024-08-25 DIAGNOSIS — R0602 Shortness of breath: Secondary | ICD-10-CM | POA: Diagnosis not present

## 2024-08-25 DIAGNOSIS — I10 Essential (primary) hypertension: Secondary | ICD-10-CM

## 2024-08-25 DIAGNOSIS — E7800 Pure hypercholesterolemia, unspecified: Secondary | ICD-10-CM | POA: Diagnosis not present

## 2024-08-25 DIAGNOSIS — R5383 Other fatigue: Secondary | ICD-10-CM | POA: Diagnosis not present

## 2024-08-25 DIAGNOSIS — R7303 Prediabetes: Secondary | ICD-10-CM

## 2024-08-25 DIAGNOSIS — Z6833 Body mass index (BMI) 33.0-33.9, adult: Secondary | ICD-10-CM | POA: Diagnosis not present

## 2024-08-25 DIAGNOSIS — Z1331 Encounter for screening for depression: Secondary | ICD-10-CM | POA: Diagnosis not present

## 2024-08-25 NOTE — Assessment & Plan Note (Signed)
 Vitals:   08/25/24 0700  BP: 135/84    Blood pressure is not at goal for age and risk category.  On hydrochlorothiazide  without adverse effects.  Most recent renal parameters reviewed which showed stable electrolytes and kidney function at [facility].  Continue with weight loss therapy. Losing 10% may improve blood pressure control. Monitor for symptoms of orthostasis while losing weight. Continue current regimen and home monitoring for a goal blood pressure of < 120/80.

## 2024-08-25 NOTE — Assessment & Plan Note (Signed)
PHQ2

## 2024-08-25 NOTE — Assessment & Plan Note (Signed)
 Obesity Related Diseases and Complications  Obesity Quality of Life and Psychosocial Complications: Decrease physical activity and social participation  Cardiometabolic: Prediabetes and/or insulin  resistance, Dyslipidemia or hypercholesterolemia, and Hypertension  Biomechanical: Osteoarthritis of the knee or hip   Contributing factors:family history of obesity, disruption of circadian rhythm / sleep disordered breathing, consumption of processed foods, moderate to high levels of stress, reduced physical activity, menopause, multiple weight loss attempts in the past, and sedentary job  We reviewed anthropometrics, biometrics, associated medical conditions and contributing factors with patient. she would benefit from a medically tailored reduced calorie nutrional plan based on her REE (resting energy expenditure). She will start a 1200-calorie balanced meal plan with 90 g of protein per day  See obesity treatment plan above

## 2024-08-25 NOTE — Progress Notes (Signed)
 [address] [place],  [place], [place] [address]  Office: [phone]  /  Fax: [phone]   Subjective   Initial Visit  [name] (MR# [id number]) is a 48 y.o. female who presents for evaluation and treatment of obesity and related comorbidities. Current BMI is Body mass index is 32.59 kg/m. [name] has been struggling with her weight for many years and has been unsuccessful in either losing weight, maintaining weight loss, or reaching her healthy weight goal.  [name] is currently in the action stage of change and ready to dedicate time achieving and maintaining a healthier weight. [name] is interested in becoming our patient and working on intensive lifestyle modifications including (but not limited to) diet and exercise for weight loss.  Weight history:  Anthropometrics and Bioimpedance Analysis    Body mass index is 33.09 kg/m. Body Fat Mass : 41 % Visceral Fat Mass Rating : 10 Waist to Height Ratio: TBD   Obesity Related Diseases and Complications   Obesity Quality of Life and Psychosocial Complications: Decrease physical activity and social participation   Cardiometabolic: Prediabetes and/or insulin  resistance, Dyslipidemia or hypercholesterolemia, and Hypertension   Biomechanical: Osteoarthritis of the knee or hip    Weight Related History   She was referred by: Friend or Family   When asked what they would like to accomplish? She states: Adopt a healthier eating pattern and lifestyle, Improve energy levels and physical activity, Improve existing medical conditions, and Improve quality of life   Weight history: Started to gain weight during perimenopause   Highest weight: 219   Contributing factors: family history of obesity, disruption of circadian rhythm / sleep disordered breathing, consumption of processed foods, moderate to high levels of stress, reduced physical activity, menopause, multiple weight loss attempts in the past, and sedentary job    Prior weight loss attempts: [employer], 187 lbs   Current or previous pharmacotherapy: None   Response to medication: Never tried medications   Current nutrition plan: None   Greatest challenge with dieting: cost.   Current level of physical activity: NEAT   Barriers to Exercise: time   Readiness and Motivation   On a scale from 0 to 10 How ready are you to make changes to your eating and physical activity to lose weight? 10 How important is it for you to lose weight right now ? 10 How confident are you that you can lose weight if you try? 9    Nutritional History:  Discussed the use of AI scribe software for clinical note transcription with the patient, who gave verbal consent to proceed.  History of Present Illness [name] is a 48 year old female who presents for obesity and weight management consultation.  She has a history of elevated blood pressure since her pregnancy and has attempted weight loss multiple times, including using the [place] program, which helped her reach 187 pounds. Currently, she does not follow any specific diet and eats out approximately twice a week. 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 She has a family history of overweight members.  In June 2025, she underwent lab work including CBC, complete metabolic panel, lipid panel, and fasting glucose, which was 109 mg/dL. Her A1c from December 2024 was 5.7%, and she believes she had another A1c test in June 2025. She is unsure if her B12 and vitamin D  levels were checked. She has not been on any medications for weight management.  Her physical activity is limited, but she has started using stretch bands and aims for 8,000 to 10,000 steps daily. During a recent vacation, she increased her activity to 20,000 steps per day and did not gain weight.  Her diet includes regular soda consumption once or twice a week, and  she tries to avoid highly processed foods. She does not skip meals and is working on reducing stress eating, which occurs when she eats quickly during work breaks. She identifies time constraints as a barrier to exercise, as she prepares meals after work and aims to avoid eating after 8 PM.   Current nutrition plan: None.  How many times do you eat outside the home: 1-2 per week  How often do they skip meals: does not skip meals  What beverages do they drink: regular soda  and juice.   Use of artificial sweetners : No  Food intolerances or dislikes: celery.  Food triggers: Stress.  Food cravings: Sugary and Starches / Carbohydrates  Do they struggle with excessive hunger or portion control : No    Physical Activity:  Current level of physical activity: NEAT  Barriers to Exercise: time   Past medical history includes:   Past Medical History:  Diagnosis Date   Back pain    Carpal tunnel syndrome of left wrist    Constipation    Heartburn    Hyperlipidemia    Hypertension    Joint pain    Pre-diabetes    SOB (shortness of breath) on exertion      Objective   BP 135/84   Pulse 80   Temp 98.7 F (37.1 C)   Ht 5' 6.5 (1.689 m)   Wt 205 lb (93 kg)   LMP 07/24/2024 (Approximate)   SpO2 100%   BMI 32.59 kg/m  She was weighed on the bioimpedance scale: Body mass index is 32.59 kg/m.    Anthropometrics:  Vitals Temp: 98.7 F (37.1 C) BP: 135/84 Pulse Rate: 80 SpO2: 100 %   Anthropometric Measurements Height: 5' 6.5 (1.689 m) Weight: 205 lb (93 kg) BMI (Calculated): 32.6 Weight at Last Visit: 0lb Weight Lost Since Last Visit: 0lb Weight Gained Since Last Visit: 0lb Starting Weight: 205lb Total Weight Loss (lbs): 0 lb (0 kg) Peak Weight: 219lb Waist Measurement : 42 inches   Body Composition  Body Fat %: 41.3 % Fat Mass (lbs): 84.8 lbs Muscle Mass (lbs): 114.6 lbs Total Body Water (lbs): 78.4 lbs Visceral Fat Rating : 10   Other Clinical  Data RMR: 749 Fasting: Yes Labs: Yes Today's Visit #: 1 Starting Date: 08/25/24    Physical Exam:  General: She is overweight, cooperative, alert, well developed, and in no acute distress. PSYCH: Has normal mood, affect and thought process.   HEENT: EOMI, sclerae are anicteric. Lungs: Normal breathing effort, no conversational dyspnea. Extremities: No edema.  Neurologic: No gross sensory or motor deficits. No tremors or fasciculations noted.    Diagnostic Data Reviewed  EKG: Normal sinus rhythm, rate 103. No conduction abnormalities, abnormal Q waves or chamber enlargement.  Indirect Calorimeter completed today shows a VO2 of 271 and a REE of 749.  Her calculated basal metabolic rate is 8332 thus her resting energy expenditure is lower than calculated but I do not feel that this result is representative of her actual metabolic rate.  Depression Screen  Chenelle's PHQ-9 score was: 2.     08/25/2024    7:26 AM  Depression screen PHQ 2/9  Decreased Interest 0  Down, Depressed, Hopeless 0  PHQ - 2 Score 0  Altered sleeping 1  Tired, decreased energy 1  Change in appetite 0  Feeling bad or failure about yourself  0  Trouble concentrating 0  Moving slowly or fidgety/restless 0  Suicidal thoughts 0  PHQ-9 Score 2  Difficult doing work/chores Not difficult at all    Screening for Sleep Related Breathing Disorders  Retal admits to daytime somnolence and admits to waking up still tired. Patient has a history of symptoms of daytime fatigue, morning fatigue, and morning headache. Anneliese generally gets 6 or 7 hours of sleep per night, and states that she has nightime awakenings. Snoring is present. Apneic episodes are not present. Epworth Sleepiness Score is 2.   BMET    Component Value Date/Time   NA 137 07/14/2024 1716   K 3.5 07/14/2024 1716   CL 101 07/14/2024 1716   CO2 22 07/14/2024 1716   GLUCOSE 109 (H) 07/14/2024 1716   BUN 10 07/14/2024 1716   CREATININE 0.94  07/14/2024 1716   CALCIUM 10.2 07/14/2024 1716   GFRNONAA >60 07/14/2024 1716   GFRAA  01/05/2010 0740    >60        The eGFR has been calculated using the MDRD equation. This calculation has not been validated in all clinical situations. eGFR's persistently <60 mL/min signify possible Chronic Kidney Disease.   No results found for: HGBA1C No results found for: INSULIN  CBC    Component Value Date/Time   WBC 10.0 07/14/2024 1716   RBC 4.99 07/14/2024 1716   HGB 14.2 07/14/2024 1716   HCT 41.6 07/14/2024 1716   PLT 312 07/14/2024 1716   MCV 83.4 07/14/2024 1716   MCH 28.5 07/14/2024 1716   MCHC 34.1 07/14/2024 1716   RDW 13.2 07/14/2024 1716   Iron/TIBC/Ferritin/ %Sat No results found for: IRON, TIBC, FERRITIN, IRONPCTSAT Lipid Panel  No results found for: CHOL, TRIG, HDL, CHOLHDL, VLDL, LDLCALC, LDLDIRECT Hepatic Function Panel     Component Value Date/Time   PROT 5.9 (L) 12/07/2007 0505   ALBUMIN 3.0 (L) 12/07/2007 0505   AST 24 12/07/2007 0505   ALT 34 12/07/2007 0505   ALKPHOS 128 (H) 12/07/2007 0505   BILITOT 0.5 12/07/2007 0505   No results found for: TSH   Assessment and Plan   TREATMENT PLAN FOR OBESITY:  Recommended Dietary Goals  Glenetta is currently in the action stage of change. As such, her goal is to implement medically supervised obesity management plan.  She has agreed to implement: the Category 2 plan - 1200 kcal per day  Behavioral Intervention  We discussed the following Behavioral Modification Strategies today: increasing lean protein intake to established goals, decreasing simple carbohydrates , increasing vegetables, increasing lower glycemic fruits, increasing fiber rich foods, avoiding skipping meals, increasing water intake, work on meal planning and preparation, reading food labels , keeping healthy foods at home, identifying sources and decreasing liquid calories, decreasing eating out or consumption of  processed foods, and making healthy choices when eating convenient foods, planning for success, and better snacking choices  Additional resources provided today: Handout on healthy eating and balanced plate, Handout on complex carbohydrates and lean sources of protein, Category 2 packet, and Handout principles of weight management  Recommended Physical Activity Goals  Allante has been advised to work up to 150 minutes of moderate intensity aerobic activity a week and strengthening exercises 2-3 times per week for cardiovascular health, weight loss maintenance and preservation of muscle mass.  She has agreed to :  Think about enjoyable ways to increase daily physical activity and overcoming barriers to exercise, Increase physical activity in their day and reduce sedentary time (increase NEAT)., Increase volume of physical activity to a goal of 240 minutes a week, and Combine aerobic and strengthening exercises for efficiency and improved cardiometabolic health.  Medical Interventions and Pharmacotherapy We will work on building a Therapist, art and behavioral strategies. We will discuss the role of pharmacotherapy as an adjunct at subsequent visits.   ASSOCIATED CONDITIONS ADDRESSED TODAY  Other Fatigue   Shortness of Breath   Assessment & Plan Other fatigue  Alisson does feel that her weight is causing her energy to be lower than it should be. Fatigue may be related to obesity, depression or many other causes. Labs will be ordered, and in the meanwhile, Blessin will focus on self care including making healthy food choices, increasing physical activity and focusing on stress reduction. Short of breath on exertion Lariya notes increasing shortness of breath with physical activity and seems to be worsening over time with weight gain. She notes getting out of breath sooner with activity than she used to. This has not gotten worse recently. Elivia denies shortness of breath at  rest or orthopnea. Depression screening  Prediabetes PHQ 2 Primary hypertension Vitals:   08/25/24 0700  BP: 135/84    Blood pressure is not at goal for age and risk category.  On hydrochlorothiazide  without adverse effects.  Most recent renal parameters reviewed which showed stable electrolytes and kidney function at [facility].  Continue with weight loss therapy. Losing 10% may improve blood pressure control. Monitor for symptoms of orthostasis while losing weight. Continue current regimen and home monitoring for a goal blood pressure of < 120/80.   Pure hypercholesterolemia We checked in Care Everywhere unable to download recent labs effectively.  Patient will bring in test results in a paper format at the next visit.  We will then assess for cardiovascular risk.  We discussed reducing saturated fats in her diet to less than 10% of calories also simple and added sugars.  Doing combined strengthening and cardio will also be of benefit. Class 1 obesity with serious comorbidity and body mass index (BMI) of 33.0 to 33.9 in adult, unspecified obesity type Obesity Related Diseases and Complications  Obesity Quality of Life and Psychosocial Complications: Decrease physical activity and social participation  Cardiometabolic: Prediabetes and/or insulin  resistance, Dyslipidemia or hypercholesterolemia, and Hypertension  Biomechanical: Osteoarthritis of the knee or hip   Contributing factors:family history of obesity, disruption of circadian rhythm / sleep disordered breathing, consumption of processed foods, moderate to high levels of stress, reduced physical activity, menopause, multiple weight loss attempts in the past, and sedentary job  We reviewed anthropometrics, biometrics, associated medical conditions and contributing factors with patient. she would benefit from a medically tailored reduced calorie nutrional plan based on her REE (resting energy expenditure). She  will start a 1200-calorie balanced meal plan with 90 g of protein per day  See obesity treatment plan above     Follow-up  She was informed of the importance of frequent follow-up visits to maximize her success with intensive lifestyle modifications for her multiple health conditions. She was informed we would discuss her lab results at her next visit unless there is a critical issue that needs to be addressed sooner. Fujie agreed to keep her next visit at the agreed upon time to discuss these results.  Attestation Statement  This is  the patient's intake visit at [facility]. The patient's Health Questionnaire was reviewed at length. Included in the packet: current and past health history, medications, allergies, ROS, gynecologic history (women only), surgical history, family history, social history, weight history, weight loss surgery history (for those that have had weight loss surgery), nutritional evaluation, mood and food questionnaire, PHQ9, Epworth questionnaire, sleep habits questionnaire, patient life and health improvement goals questionnaire. These will all be scanned into the patient's chart under media.   During the visit, I independently reviewed the patient's EKG, previous labs, bioimpedance scale results, and indirect calorimetry results. I used this information to medically tailor a meal plan for the patient that will help her to lose weight and will improve her obesity-related conditions. I performed a medically necessary appropriate examination and/or evaluation. I discussed the assessment and treatment plan with the patient. The patient was provided an opportunity to ask questions and all were answered. The patient agreed with the plan and demonstrated an understanding of the instructions. Labs were ordered at this visit and will be reviewed at the next visit unless critical results need to be addressed immediately. Clinical information was updated and documented in  the EMR.   In addition, they received basic education on identification of processed foods and reduction of these, different sources of lean proteins and complex carbohydrates and how to eat balanced by incorporation of whole foods.  Reviewed by clinician on day of visit: allergies, medications, problem list, medical history, surgical history, family history, social history, and previous encounter notes.  I have spent 51 minutes in the care of the patient today including: 3 minutes before the visit reviewing and preparing the chart. 38 minutes face-to-face assessing and reviewing listed medical problems as outlined in obesity care plan, providing nutritional and behavioral counseling on topics outlined in the obesity care plan, independently interpreting test results and goals of care, as described in assessment and plan, reviewing and discussing biometric information and progress, ordering diagnostics - see orders, and trying to retrieve information in Care Everywhere 10 minutes after the visit updating chart and documentation of encounter.       Lucas Parker, MD

## 2024-08-25 NOTE — Progress Notes (Deleted)
 [address] [place],  [place], [place] [address]  Office: [phone]  /  Fax: [phone]   Subjective   Initial Visit  [name] (MR# [id number]) is a 49 y.o. female who presents for evaluation and treatment of obesity and related comorbidities. Current BMI is There is no height or weight on file to calculate BMI. [name] has been struggling with her weight for many years and has been unsuccessful in either losing weight, maintaining weight loss, or reaching her healthy weight goal.  [name] is currently in the action stage of change and ready to dedicate time achieving and maintaining a healthier weight. [name] is interested in becoming our patient and working on intensive lifestyle modifications including (but not limited to) diet and exercise for weight loss.  Weight history:  When asked how their weight has affected their life and health, she states: {EMWeightAffected:28305}  When asked what else they would like to accomplish? She states: {EMHopetoaccomplish:28304::Adopt a healthier eating pattern and lifestyle,Improve energy levels and physical activity,Improve existing medical conditions,Improve quality of life}  She starting to note weight gain during : {emstartedtogainweight:31588}.  Life events associated with weight gain include : {emlifeeventsweighgain:31589}.   Other contributing factors: {EMcontributingfactors:28307}.  Their highest weight has been:  *** lbs.  Desired weight: ***  Previous weight-loss programs : {emweightlossprograms:31590::None}.  Their maximum weight loss was:  *** lbs.  Their greatest challenge with dieting: {emgreatestchallengediet:31593}.  Current or previous pharmacotherapy: {EM previousRx:28311}.  Response to medication: {EMResponsetomedication:28312}   Nutritional History:  Current nutrition plan: {EMNutritionplan:28309::None}.  How many times do you eat outside the home: {emfrequency:31645}  How often do they  skip meals: {emskipmeals:31594::does not skip meals}  What beverages do they drink: {embeverages:31595}.   Use of artificial sweetners : {Yes/No:30480221}  Food intolerances or dislikes: {emfoodintolerance:31596::none}.  Food triggers: {emfoodtriggers:31600::None}.  Food cravings: {emfoodcravings:31601}  Do they struggle with excessive hunger or portion control : {YES/NO:21197}   Physical Activity:  Current level of physical activity: {EMcurrentPA:28310::None}  Barriers to Exercise: {embarrierstoexercise:32606::no barriers}   Past medical history includes:   Past Medical History:  Diagnosis Date   Carpal tunnel syndrome of left wrist    Hypertension      Objective   LMP 07/24/2024 (Approximate)  She was weighed on the bioimpedance scale: There is no height or weight on file to calculate BMI.    Anthropometrics:  No data recorded No data recorded No data recorded No data recorded  Physical Exam:  General: She is overweight, cooperative, alert, well developed, and in no acute distress. PSYCH: Has normal mood, affect and thought process.   HEENT: EOMI, sclerae are anicteric. Lungs: Normal breathing effort, no conversational dyspnea. Extremities: No edema.  Neurologic: No gross sensory or motor deficits. No tremors or fasciculations noted.    Diagnostic Data Reviewed  EKG: Normal sinus rhythm, rate ***. No conduction abnormalities, abnormal Q waves or chamber enlargement.  Indirect Calorimeter completed today shows a VO2 of *** and a REE of ***.  Her calculated basal metabolic rate is *** thus her {zfprmzdlou:68397}.  Depression Screen  [name]'s PHQ-9 score was: ***.     07/27/2024    4:09 PM  Depression screen PHQ 2/9  Decreased Interest 0  Down, Depressed, Hopeless 0  PHQ - 2 Score 0    Screening for Sleep Related Breathing Disorders  [name] {Actions; denies/reports/admits to:19208} daytime somnolence and {Actions; denies/reports/admits  to:19208} waking up still tired. Patient has a history of symptoms of {OSA Sx:17850}. [name] generally gets {numbers (fuzzy):14653} hours of sleep per  night, and states that she has {sleep quality:17851}. Snoring {is/are not:32546} present. Apneic episodes {is/are not:32546} present. Epworth Sleepiness Score is ***.   BMET    Component Value Date/Time   NA 137 07/14/2024 1716   K 3.5 07/14/2024 1716   CL 101 07/14/2024 1716   CO2 22 07/14/2024 1716   GLUCOSE 109 (H) 07/14/2024 1716   BUN 10 07/14/2024 1716   CREATININE 0.94 07/14/2024 1716   CALCIUM 10.2 07/14/2024 1716   GFRNONAA >60 07/14/2024 1716   GFRAA  01/05/2010 0740    >60        The eGFR has been calculated using the MDRD equation. This calculation has not been validated in all clinical situations. eGFR's persistently <60 mL/min signify possible Chronic Kidney Disease.   No results found for: HGBA1C No results found for: INSULIN  CBC    Component Value Date/Time   WBC 10.0 07/14/2024 1716   RBC 4.99 07/14/2024 1716   HGB 14.2 07/14/2024 1716   HCT 41.6 07/14/2024 1716   PLT 312 07/14/2024 1716   MCV 83.4 07/14/2024 1716   MCH 28.5 07/14/2024 1716   MCHC 34.1 07/14/2024 1716   RDW 13.2 07/14/2024 1716   Iron/TIBC/Ferritin/ %Sat No results found for: IRON, TIBC, FERRITIN, IRONPCTSAT Lipid Panel  No results found for: CHOL, TRIG, HDL, CHOLHDL, VLDL, LDLCALC, LDLDIRECT Hepatic Function Panel     Component Value Date/Time   PROT 5.9 (L) 12/07/2007 0505   ALBUMIN 3.0 (L) 12/07/2007 0505   AST 24 12/07/2007 0505   ALT 34 12/07/2007 0505   ALKPHOS 128 (H) 12/07/2007 0505   BILITOT 0.5 12/07/2007 0505   No results found for: TSH   Assessment and Plan   TREATMENT PLAN FOR OBESITY:  Recommended Dietary Goals  [name] is currently in the action stage of change. As such, her goal is to implement medically supervised obesity management plan.  She has agreed to implement:  {emwtlossplannewint:31639}  Behavioral Intervention  We discussed the following Behavioral Modification Strategies today: {EMwtlossstrategiesnewint:31640::increasing lean protein intake to established goals,decreasing simple carbohydrates ,increasing vegetables,increasing lower glycemic fruits,increasing fiber rich foods,avoiding skipping meals,increasing water intake,work on meal planning and preparation,reading food labels ,keeping healthy foods at home,identifying sources and decreasing liquid calories,decreasing eating out or consumption of processed foods, and making healthy choices when eating convenient foods,planning for success,better snacking choices}  Additional resources provided today: {emadditionalresourcesnewint:32116::Handout on healthy eating and balanced plate,Handout on complex carbohydrates and lean sources of protein,Handout principles of weight management}  Recommended Physical Activity Goals  Lova has been advised to work up to 150 minutes of moderate intensity aerobic activity a week and strengthening exercises 2-3 times per week for cardiovascular health, weight loss maintenance and preservation of muscle mass.   She has agreed to :  {EMEXERCISE:28847::Think about enjoyable ways to increase daily physical activity and overcoming barriers to exercise,Increase physical activity in their day and reduce sedentary time (increase NEAT).,Increase volume of physical activity to a goal of 240 minutes a week,Combine aerobic and strengthening exercises for efficiency and improved cardiometabolic health.}  Medical Interventions and Pharmacotherapy We will work on building a Therapist, art and behavioral strategies. We will discuss the role of pharmacotherapy as an adjunct at subsequent visits.   ASSOCIATED CONDITIONS ADDRESSED TODAY  Other Fatigue ***. Delorese does feel that her weight is causing her energy to be lower  than it should be. Fatigue may be related to obesity, depression or many other causes. Labs will be ordered, and in the meanwhile, Krystiana will focus  on self care including making healthy food choices, increasing physical activity and focusing on stress reduction.  Shortness of Breath Hildagard notes increasing shortness of breath with physical activity and seems to be worsening over time with weight gain. She notes getting out of breath sooner with activity than she used to. This has not gotten worse recently. Verline denies shortness of breath at rest or orthopnea.  Assessment & Plan     Follow-up  She was informed of the importance of frequent follow-up visits to maximize her success with intensive lifestyle modifications for her multiple health conditions. She was informed we would discuss her lab results at her next visit unless there is a critical issue that needs to be addressed sooner. Vibha agreed to keep her next visit at the agreed upon time to discuss these results.  Attestation Statement  This is the patient's intake visit at [facility]. The patient's Health Questionnaire was reviewed at length. Included in the packet: current and past health history, medications, allergies, ROS, gynecologic history (women only), surgical history, family history, social history, weight history, weight loss surgery history (for those that have had weight loss surgery), nutritional evaluation, mood and food questionnaire, PHQ9, Epworth questionnaire, sleep habits questionnaire, patient life and health improvement goals questionnaire. These will all be scanned into the patient's chart under media.   During the visit, I independently reviewed the patient's EKG***, previous labs, bioimpedance scale results, and indirect calorimetry results. I used this information to medically tailor a meal plan for the patient that will help her to lose weight and will improve her obesity-related conditions. I  performed a medically necessary appropriate examination and/or evaluation. I discussed the assessment and treatment plan with the patient. The patient was provided an opportunity to ask questions and all were answered. The patient agreed with the plan and demonstrated an understanding of the instructions. Labs were ordered at this visit and will be reviewed at the next visit unless critical results need to be addressed immediately. Clinical information was updated and documented in the EMR.   In addition, they received basic education on identification of processed foods and reduction of these, different sources of lean proteins and complex carbohydrates and how to eat balanced by incorporation of whole foods.  Reviewed by clinician on day of visit: allergies, medications, problem list, medical history, surgical history, family history, social history, and previous encounter notes.  I have spent *** minutes in the care of the patient today including: {NUMBER 1-10:22536} minutes before the visit reviewing and preparing the chart. *** minutes face-to-face {emfacetoface:32598::assessing and reviewing listed medical problems as outlined in obesity care plan,providing nutritional and behavioral counseling on topics outlined in the obesity care plan,independently interpreting test results and goals of care, as described in assessment and plan,reviewing and discussing biometric information and progress} {NUMBER 1-10:22536} minutes after the visit updating chart and documentation of encounter.       Lucas Parker, MD

## 2024-08-25 NOTE — Assessment & Plan Note (Signed)
 We checked in Care Everywhere unable to download recent labs effectively.  Patient will bring in test results in a paper format at the next visit.  We will then assess for cardiovascular risk.  We discussed reducing saturated fats in her diet to less than 10% of calories also simple and added sugars.  Doing combined strengthening and cardio will also be of benefit.

## 2024-08-26 LAB — VITAMIN B12: Vitamin B-12: 464 pg/mL (ref 232–1245)

## 2024-08-26 LAB — VITAMIN D 25 HYDROXY (VIT D DEFICIENCY, FRACTURES): Vit D, 25-Hydroxy: 19.5 ng/mL — ABNORMAL LOW (ref 30.0–100.0)

## 2024-08-26 LAB — INSULIN, RANDOM: INSULIN: 18.3 u[IU]/mL (ref 2.6–24.9)

## 2024-08-26 NOTE — Telephone Encounter (Signed)
 Schedule OV

## 2024-09-08 ENCOUNTER — Other Ambulatory Visit (HOSPITAL_COMMUNITY): Payer: Self-pay

## 2024-09-08 ENCOUNTER — Ambulatory Visit (INDEPENDENT_AMBULATORY_CARE_PROVIDER_SITE_OTHER): Admitting: Internal Medicine

## 2024-09-08 ENCOUNTER — Encounter (INDEPENDENT_AMBULATORY_CARE_PROVIDER_SITE_OTHER): Payer: Self-pay | Admitting: Internal Medicine

## 2024-09-08 VITALS — BP 135/89 | HR 98 | Temp 98.5°F | Ht 66.5 in | Wt 201.0 lb

## 2024-09-08 DIAGNOSIS — R7303 Prediabetes: Secondary | ICD-10-CM

## 2024-09-08 DIAGNOSIS — E559 Vitamin D deficiency, unspecified: Secondary | ICD-10-CM | POA: Diagnosis not present

## 2024-09-08 DIAGNOSIS — Z6833 Body mass index (BMI) 33.0-33.9, adult: Secondary | ICD-10-CM

## 2024-09-08 DIAGNOSIS — E66811 Obesity, class 1: Secondary | ICD-10-CM

## 2024-09-08 DIAGNOSIS — E88819 Insulin resistance, unspecified: Secondary | ICD-10-CM

## 2024-09-08 DIAGNOSIS — I10 Essential (primary) hypertension: Secondary | ICD-10-CM | POA: Diagnosis not present

## 2024-09-08 DIAGNOSIS — E7800 Pure hypercholesterolemia, unspecified: Secondary | ICD-10-CM

## 2024-09-08 MED ORDER — VITAMIN D (ERGOCALCIFEROL) 1.25 MG (50000 UNIT) PO CAPS
50000.0000 [IU] | ORAL_CAPSULE | ORAL | 0 refills | Status: DC
  Filled 2024-09-08: qty 14, 98d supply, fill #0

## 2024-09-08 NOTE — Progress Notes (Signed)
 Office: [phone]  /  Fax: [phone]  Weight Summary and Body Composition Analysis (BIA)  Vitals Temp: 98.5 F (36.9 C) BP: 135/89 Pulse Rate: 98 SpO2: 97 %   Anthropometric Measurements Height: 5' 6.5 (1.689 m) Weight: 201 lb (91.2 kg) BMI (Calculated): 31.96 Weight at Last Visit: 205 lb Weight Lost Since Last Visit: 4 lb Weight Gained Since Last Visit: 0 lb Starting Weight: 205 lb Total Weight Loss (lbs): 4 lb (1.814 kg) Peak Weight: 219 lb   Body Composition  Body Fat %: 39.9 % Fat Mass (lbs): 80.2 lbs Muscle Mass (lbs): 114.6 lbs Total Body Water (lbs): 78.2 lbs Visceral Fat Rating : 9    RMR: 1875 (1st IC = 749, 2nd IC = 1875)  Today's Visit #: 2  Starting Date: 08/25/24   Subjective   Chief Complaint: Obesity  Interval History Discussed the use of AI scribe software for clinical note transcription with the patient, who gave verbal consent to proceed.  History of Present Illness [name] is a 48 year old female who presents for medical weight management.  She has lost four pounds since her last visit and is adhering to a 1200 calorie nutrition plan, focusing on whole foods and ensuring adequate protein intake. She is not regularly exercising, though she walked last Sunday.  She experiences cravings, particularly for sweets, and recently consumed an Oreo cookie. Her son assists in managing these cravings by reminding her of her dietary goals. She is concerned about her protein intake, especially at night, and sometimes feels the need to eat fruit after dinner. She is considering increasing her protein portion to six ounces to help with satiety.  She consumes snacks within a 200 calorie limit, often choosing a 100 calorie snack bar. She is exploring protein-rich snacks like [place] yogurt, boiled eggs, and protein balls.  Recent blood work shows an LDL of 88, triglycerides within normal range, HDL of 47, fasting glucose of 109, and an  A1c of 6.0. She is vitamin D  deficient and reports low energy levels.  She is currently taking hydrochlorothiazide  for blood pressure management, with a recent reading of 135/89.  She has a family history of breast cancer, with her paternal aunt having passed away from the disease. She has a personal history of fibroid tumors and undergoes regular mammograms.     Challenges affecting patient progress: low volume of physical activity at present .    Pharmacotherapy for weight management: She is currently taking no anti-obesity medication.   Assessment and Plan   Treatment Plan For Obesity:  Recommended Dietary Goals  [name] is currently in the action stage of change. As such, her goal is to continue weight management plan. She has agreed to: continue current plan  Behavioral Health and Counseling  We discussed the following behavioral modification strategies today: increasing lean protein intake to established goals, increasing water intake , continue to work on maintaining a reduced calorie state, getting the recommended amount of protein, incorporating whole foods, making healthy choices, staying well hydrated and practicing mindfulness when eating., and increase protein intake, fibrous foods (25 grams per day for women, 30 grams for men) and water to improve satiety and decrease hunger signals. .  Additional education and resources provided today: Handout on increasing daily activity and exercise goal setting  Recommended Physical Activity Goals  [name] has been advised to work up to 150 minutes of moderate intensity aerobic activity a week and strengthening exercises 2-3 times per week for cardiovascular health,  weight loss maintenance and preservation of muscle mass.  She has agreed to :  Think about enjoyable ways to increase daily physical activity and overcoming barriers to exercise, Increase physical activity in their day and reduce sedentary time (increase NEAT)., Increase  volume of physical activity to a goal of 240 minutes a week, and Combine aerobic and strengthening exercises for efficiency and improved cardiometabolic health.  Medical Interventions and Pharmacotherapy  We discussed various medication options to help [name] with her weight loss efforts and we both agreed to : Continue with current nutritional and behavioral strategies  Associated Conditions Impacted by Obesity Treatment  Assessment & Plan Vitamin D  deficiency Most recent vitamin D  levels  Lab Results  Component Value Date   VD25OH 19.5 (L) 08/25/2024     Deficiency state associated with adiposity and may result in leptin resistance, weight gain and fatigue.   Plan: After discussion of benefits, alternative treatment options and side effects patient will be started on vitamin D2 50,000 units 1 tablet weekly for 3-4 months. for a treatment goal level of 50-60 mg/dl. Check levels at that time for response monitoring.  Prediabetes Prediabetes with fasting glucose of 109 mg/dL and J8r of 3.9%. Elevated insulin  levels at 18.3, indicating insulin  resistance. Discussion on the role of insulin  in energy storage and its impact on weight and cravings. Emphasis on reducing carbohydrate intake and increasing physical activity to improve insulin  sensitivity. Metformin discussed as a potential treatment option, highlighting its benefits in weight loss and insulin  sensitivity improvement, with a safety profile that includes potential B12 deficiency and gastrointestinal side effects. - Monitor blood glucose and A1c levels every 6months. - Encourage reduction of carbohydrate intake, focusing on whole grains and portion control. - Consider metformin as a potential future treatment option if lifestyle changes are insufficient. Pure hypercholesterolemia I reviewed labs from [facility] she had a total cholesterol of 150 with an LDL of 88 and HDL of 47 normal triglycerides.  She is not on  statin.  Her lipid profile is within range.   Primary hypertension Hypertension with current blood pressure reading of 135/89 mmHg. Discussion on the impact of weight loss on blood pressure reduction. Currently on hydrochlorothiazide . - Monitor blood pressure regularly to assess trends.  If blood pressure staying above 130/80 I suggest working with primary care team for medication adjustments.  She could always come off medications as she continues to lose weight.  Patient counseled on risk associated with elevated blood pressure. - Continue hydrochlorothiazide  as prescribed. - Encourage weight loss to aid in blood pressure reduction. Insulin  resistance Her HOMA-IR is 4.92 which is elevated. Optimal level < 1.9.   This is complex condition associated with genetics, ectopic fat and lifestyle factors. Insulin  resistance may result in increased fat storage, inhibition of the breakdown of fat, cause fluctuations in blood sugar leading to energy crashes and increased cravings for sugary or high carb foods and cause metabolic slowdown making it difficult to lose weight.  This may result in additional weight gain and lead to pre-diabetes and diabetes if untreated. In addition, hyperinsulinemia increases cardiovascular risk, chronic inflammatory response and may increase the risk of obesity related malignancies.  No results found for: HGBA1C Lab Results  Component Value Date   INSULIN  18.3 08/25/2024   Lab Results  Component Value Date   GLUCOSE 109 (H) 07/14/2024   GLUCOSE 79 11/28/2007    We reviewed treatment options which include losing 7 to 10% of body weight, increasing volume of physical  activity and maintaining a diet low in saturated fats and with a low glycemic load.  Patient has also been educated on the carb insulin  model of obesity.  We discussed the benefits of metformin as well as side effects she would like to hold off pharmacoprophylaxis. Class 1 obesity with serious  comorbidity and body mass index (BMI) of 33.0 to 33.9 in adult, unspecified obesity type Obesity management with a focus on dietary changes and potential exercise incorporation. Weight loss of four pounds since last visit, with a reduction in body fat percentage and visceral fat. No exercise currently, but potential benefits of exercise discussed, including muscle activation and its systemic benefits. Emphasis on differentiating between cravings and hunger, and the importance of protein intake for satiety and muscle maintenance. - Continue 1200 calorie nutrition plan with focus on whole foods and adequate protein intake. - Increase protein intake to 4-6 ounces per meal if possible. - Incorporate more vegetables into meals. - Limit snacks to 200 calories per day, with a focus on protein-rich snacks. - Encourage initiation of physical activity, aiming for 150 minutes of cardio per week and 2-3 sessions of muscle strengthening. - Consider mixed exercise for optimal glycemic and metabolic benefits.          Objective   Physical Exam:  Blood pressure 135/89, pulse 98, temperature 98.5 F (36.9 C), height 5' 6.5 (1.689 m), weight 201 lb (91.2 kg), last menstrual period 08/25/2024, SpO2 97%. Body mass index is 31.96 kg/m.  General: She is overweight, cooperative, alert, well developed, and in no acute distress. PSYCH: Has normal mood, affect and thought process.   HEENT: EOMI, sclerae are anicteric. Lungs: Normal breathing effort, no conversational dyspnea. Extremities: No edema.  Neurologic: No gross sensory or motor deficits. No tremors or fasciculations noted.    Diagnostic Data Reviewed:  BMET    Component Value Date/Time   NA 137 07/14/2024 1716   K 3.5 07/14/2024 1716   CL 101 07/14/2024 1716   CO2 22 07/14/2024 1716   GLUCOSE 109 (H) 07/14/2024 1716   BUN 10 07/14/2024 1716   CREATININE 0.94 07/14/2024 1716   CALCIUM 10.2 07/14/2024 1716   GFRNONAA >60 07/14/2024 1716    GFRAA  01/05/2010 0740    >60        The eGFR has been calculated using the MDRD equation. This calculation has not been validated in all clinical situations. eGFR's persistently <60 mL/min signify possible Chronic Kidney Disease.   No results found for: HGBA1C Lab Results  Component Value Date   INSULIN  18.3 08/25/2024   No results found for: TSH CBC    Component Value Date/Time   WBC 10.0 07/14/2024 1716   RBC 4.99 07/14/2024 1716   HGB 14.2 07/14/2024 1716   HCT 41.6 07/14/2024 1716   PLT 312 07/14/2024 1716   MCV 83.4 07/14/2024 1716   MCH 28.5 07/14/2024 1716   MCHC 34.1 07/14/2024 1716   RDW 13.2 07/14/2024 1716   Iron Studies No results found for: IRON, TIBC, FERRITIN, IRONPCTSAT Lipid Panel  No results found for: CHOL, TRIG, HDL, CHOLHDL, VLDL, LDLCALC, LDLDIRECT Hepatic Function Panel     Component Value Date/Time   PROT 5.9 (L) 12/07/2007 0505   ALBUMIN 3.0 (L) 12/07/2007 0505   AST 24 12/07/2007 0505   ALT 34 12/07/2007 0505   ALKPHOS 128 (H) 12/07/2007 0505   BILITOT 0.5 12/07/2007 0505   No results found for: TSH Nutritional Lab Results  Component Value Date   VD25OH  19.5 (L) 08/25/2024    Medications: Outpatient Encounter Medications as of 09/08/2024  Medication Sig   Vitamin D , Ergocalciferol , (DRISDOL ) 1.25 MG (50000 UNIT) CAPS capsule Take 1 capsule (50,000 Units total) by mouth every 7 (seven) days.   fluticasone  (FLONASE ) 50 MCG/ACT nasal spray Place 1 spray into both nostrils 2 (two) times daily.   hydrochlorothiazide  (HYDRODIURIL ) 25 MG tablet Take 1 tablet (25 mg total) by mouth daily.   ibuprofen  (ADVIL ) 600 MG tablet Take 1 tablet (600 mg total) by mouth every 6 (six) hours as needed for mild pain.   Norethindrone  Acetate-Ethinyl Estrad-FE (LOESTRIN  24 FE) 1-20 MG-MCG(24) tablet Take 1 tablet by mouth daily.   No facility-administered encounter medications on file as of 09/08/2024.     Follow-Up    Return in about 3 weeks (around 09/29/2024) for For Weight Mangement with Dr. [email or web address].[email or web address] She was informed of the importance of frequent follow up visits to maximize her success with intensive lifestyle modifications for her multiple health conditions.  Attestation Statement   Reviewed by clinician on day of visit: allergies, medications, problem list, medical history, surgical history, family history, social history, and previous encounter notes.   I have spent 44 minutes in the care of the patient today including: 4 minutes before the visit reviewing and preparing the chart. 34 minutes face-to-face assessing and reviewing listed medical problems as outlined in obesity care plan, providing nutritional and behavioral counseling on topics outlined in the obesity care plan, counseling regarding anti-obesity medication as outlined in obesity care plan, independently interpreting test results and goals of care, as described in assessment and plan, reviewing and discussing biometric information and progress, and ordering medications - see orders 6 minutes after the visit updating chart and documentation of encounter.    Lucas Francyne, MD

## 2024-09-09 ENCOUNTER — Other Ambulatory Visit (HOSPITAL_COMMUNITY): Payer: Self-pay

## 2024-09-09 DIAGNOSIS — E559 Vitamin D deficiency, unspecified: Secondary | ICD-10-CM | POA: Insufficient documentation

## 2024-09-09 DIAGNOSIS — E88819 Insulin resistance, unspecified: Secondary | ICD-10-CM | POA: Insufficient documentation

## 2024-09-09 NOTE — Assessment & Plan Note (Signed)
 I reviewed labs from [facility] she had a total cholesterol of 150 with an LDL of 88 and HDL of 47 normal triglycerides.  She is not on statin.  Her lipid profile is within range.

## 2024-09-09 NOTE — Assessment & Plan Note (Signed)
 Prediabetes with fasting glucose of 109 mg/dL and J8r of 3.9%. Elevated insulin  levels at 18.3, indicating insulin  resistance. Discussion on the role of insulin  in energy storage and its impact on weight and cravings. Emphasis on reducing carbohydrate intake and increasing physical activity to improve insulin  sensitivity. Metformin discussed as a potential treatment option, highlighting its benefits in weight loss and insulin  sensitivity improvement, with a safety profile that includes potential B12 deficiency and gastrointestinal side effects. - Monitor blood glucose and A1c levels every 6months. - Encourage reduction of carbohydrate intake, focusing on whole grains and portion control. - Consider metformin as a potential future treatment option if lifestyle changes are insufficient.

## 2024-09-09 NOTE — Assessment & Plan Note (Signed)
 Most recent vitamin D  levels  Lab Results  Component Value Date   VD25OH 19.5 (L) 08/25/2024     Deficiency state associated with adiposity and may result in leptin resistance, weight gain and fatigue.   Plan: After discussion of benefits, alternative treatment options and side effects patient will be started on vitamin D2 50,000 units 1 tablet weekly for 3-4 months. for a treatment goal level of 50-60 mg/dl. Check levels at that time for response monitoring.

## 2024-09-09 NOTE — Assessment & Plan Note (Signed)
 Obesity management with a focus on dietary changes and potential exercise incorporation. Weight loss of four pounds since last visit, with a reduction in body fat percentage and visceral fat. No exercise currently, but potential benefits of exercise discussed, including muscle activation and its systemic benefits. Emphasis on differentiating between cravings and hunger, and the importance of protein intake for satiety and muscle maintenance. - Continue 1200 calorie nutrition plan with focus on whole foods and adequate protein intake. - Increase protein intake to 4-6 ounces per meal if possible. - Incorporate more vegetables into meals. - Limit snacks to 200 calories per day, with a focus on protein-rich snacks. - Encourage initiation of physical activity, aiming for 150 minutes of cardio per week and 2-3 sessions of muscle strengthening. - Consider mixed exercise for optimal glycemic and metabolic benefits.

## 2024-09-09 NOTE — Assessment & Plan Note (Signed)
 Hypertension with current blood pressure reading of 135/89 mmHg. Discussion on the impact of weight loss on blood pressure reduction. Currently on hydrochlorothiazide . - Monitor blood pressure regularly to assess trends.  If blood pressure staying above 130/80 I suggest working with primary care team for medication adjustments.  She could always come off medications as she continues to lose weight.  Patient counseled on risk associated with elevated blood pressure. - Continue hydrochlorothiazide  as prescribed. - Encourage weight loss to aid in blood pressure reduction.

## 2024-09-09 NOTE — Assessment & Plan Note (Signed)
 Her HOMA-IR is 4.92 which is elevated. Optimal level < 1.9.   This is complex condition associated with genetics, ectopic fat and lifestyle factors. Insulin  resistance may result in increased fat storage, inhibition of the breakdown of fat, cause fluctuations in blood sugar leading to energy crashes and increased cravings for sugary or high carb foods and cause metabolic slowdown making it difficult to lose weight.  This may result in additional weight gain and lead to pre-diabetes and diabetes if untreated. In addition, hyperinsulinemia increases cardiovascular risk, chronic inflammatory response and may increase the risk of obesity related malignancies.  No results found for: HGBA1C Lab Results  Component Value Date   INSULIN  18.3 08/25/2024   Lab Results  Component Value Date   GLUCOSE 109 (H) 07/14/2024   GLUCOSE 79 11/28/2007    We reviewed treatment options which include losing 7 to 10% of body weight, increasing volume of physical activity and maintaining a diet low in saturated fats and with a low glycemic load.  Patient has also been educated on the carb insulin  model of obesity.  We discussed the benefits of metformin as well as side effects she would like to hold off pharmacoprophylaxis.

## 2024-09-15 ENCOUNTER — Other Ambulatory Visit: Payer: Self-pay

## 2024-09-15 ENCOUNTER — Encounter: Payer: Self-pay | Admitting: Radiology

## 2024-09-15 ENCOUNTER — Other Ambulatory Visit (HOSPITAL_COMMUNITY): Payer: Self-pay

## 2024-09-15 ENCOUNTER — Ambulatory Visit (INDEPENDENT_AMBULATORY_CARE_PROVIDER_SITE_OTHER): Admitting: Radiology

## 2024-09-15 VITALS — BP 120/80 | Wt 202.0 lb

## 2024-09-15 DIAGNOSIS — N951 Menopausal and female climacteric states: Secondary | ICD-10-CM

## 2024-09-15 MED ORDER — ESTRADIOL 0.025 MG/24HR TD PTTW
1.0000 | MEDICATED_PATCH | TRANSDERMAL | 1 refills | Status: AC
  Filled 2024-09-15: qty 8, 28d supply, fill #0
  Filled 2024-10-10: qty 8, 28d supply, fill #1
  Filled 2024-11-10: qty 8, 28d supply, fill #2
  Filled 2024-12-16: qty 8, 28d supply, fill #3

## 2024-09-15 MED ORDER — PROGESTERONE MICRONIZED 100 MG PO CAPS
100.0000 mg | ORAL_CAPSULE | Freq: Every day | ORAL | 1 refills | Status: AC
  Filled 2024-09-15 (×2): qty 30, 30d supply, fill #0
  Filled 2024-10-10: qty 30, 30d supply, fill #1
  Filled 2024-11-10: qty 30, 30d supply, fill #2
  Filled 2024-12-16: qty 30, 30d supply, fill #3

## 2024-09-15 NOTE — Progress Notes (Signed)
   [name] 12/12/1976 [id number]   History:  48 y.o. G4P1 Here to discuss HRT. Currently taking Loestrin  24 FE. Complains of trouble sleeping, night sweats, increased fatigue, low Vitamin D . Discussed briefly at her AEX.  Gynecologic History Patient's last menstrual period was 08/25/2024 (exact date).   Contraception/Family planning: OCP (estrogen/progesterone ) Sexually active: yes  Obstetric History OB History  Gravida Para Term Preterm AB Living  4 1 1  3 1   SAB IAB Ectopic Multiple Live Births  3    1    # Outcome Date GA Lbr Len/2nd Weight Sex Type Anes PTL Lv  4 SAB           3 SAB           2 SAB           1 Term               The following portions of the patient's history were reviewed and updated as appropriate: allergies, current medications, past family history, past medical history, past social history, past surgical history, and problem list.  Review of Systems  All other systems reviewed and are negative.   Past medical history, past surgical history, family history and social history were all reviewed and documented in the EPIC chart.  Exam:  Vitals:   09/15/24 1500  BP: 120/80  Weight: 202 lb (91.6 kg)   Body mass index is 32.12 kg/m.  Physical Exam Constitutional:      Appearance: Normal appearance. She is normal weight.  Pulmonary:     Effort: Pulmonary effort is normal.  Neurological:     Mental Status: She is alert.  Psychiatric:        Mood and Affect: Mood normal.        Thought Content: Thought content normal.        Judgment: Judgment normal.      [name], CMA present for exam  Assessment/Plan:   1. Perimenopausal (Primary) Stop OCPs end of current pack (in 4 days) May start progesterone  tomorrow, add patch on Sunday. Risks and benefits reviewed with patient. - estradiol  (VIVELLE -DOT) 0.025 MG/24HR; Place 1 patch onto the skin 2 (two) times a week.  Dispense: 24 patch; Refill: 1 - progesterone  (PROMETRIUM ) 100  MG capsule; Take 1 capsule (100 mg total) by mouth daily.  Dispense: 90 capsule; Refill: 1    Return in about 3 months (around 12/15/2024) for Med Follow-up.  [name] B WHNP-BC 3:19 PM 09/15/2024

## 2024-09-16 ENCOUNTER — Other Ambulatory Visit (HOSPITAL_COMMUNITY): Payer: Self-pay

## 2024-09-29 ENCOUNTER — Encounter (INDEPENDENT_AMBULATORY_CARE_PROVIDER_SITE_OTHER): Payer: Self-pay | Admitting: Internal Medicine

## 2024-09-29 ENCOUNTER — Ambulatory Visit (INDEPENDENT_AMBULATORY_CARE_PROVIDER_SITE_OTHER): Admitting: Internal Medicine

## 2024-09-29 VITALS — BP 134/89 | HR 81 | Temp 98.2°F | Ht 66.5 in | Wt 195.0 lb

## 2024-09-29 DIAGNOSIS — E88819 Insulin resistance, unspecified: Secondary | ICD-10-CM | POA: Diagnosis not present

## 2024-09-29 DIAGNOSIS — E66811 Obesity, class 1: Secondary | ICD-10-CM | POA: Diagnosis not present

## 2024-09-29 DIAGNOSIS — E559 Vitamin D deficiency, unspecified: Secondary | ICD-10-CM

## 2024-09-29 DIAGNOSIS — Z6831 Body mass index (BMI) 31.0-31.9, adult: Secondary | ICD-10-CM | POA: Diagnosis not present

## 2024-09-29 DIAGNOSIS — I10 Essential (primary) hypertension: Secondary | ICD-10-CM | POA: Diagnosis not present

## 2024-09-29 DIAGNOSIS — Z6833 Body mass index (BMI) 33.0-33.9, adult: Secondary | ICD-10-CM

## 2024-09-29 DIAGNOSIS — G479 Sleep disorder, unspecified: Secondary | ICD-10-CM | POA: Insufficient documentation

## 2024-09-29 DIAGNOSIS — R7303 Prediabetes: Secondary | ICD-10-CM

## 2024-09-29 NOTE — Assessment & Plan Note (Signed)
 Sleep disturbance is primarily due to being awakened early by her husband's schedule. Sleep quality is important for overall health and weight management. - Consider sleeping in a separate room on Wednesday nights to ensure adequate rest - Aim for at least seven hours of sleep per night

## 2024-09-29 NOTE — Progress Notes (Signed)
 Office: [phone]  /  Fax: [phone]  Weight Summary and Body Composition Analysis (BIA)  Vitals Temp: 98.2 F (36.8 C) BP: 134/89 Pulse Rate: 81 SpO2: 99 %   Anthropometric Measurements Height: 5' 6.5 (1.689 m) Weight: 195 lb (88.5 kg) BMI (Calculated): 31.01 Weight at Last Visit: 201 lb Weight Lost Since Last Visit: 6 lb Weight Gained Since Last Visit: 0 lb Starting Weight: 205 lb Total Weight Loss (lbs): 10 lb (4.536 kg) Peak Weight: 219 lb   Body Composition  Body Fat %: 40 % Fat Mass (lbs): 78 lbs Muscle Mass (lbs): 111 lbs Total Body Water (lbs): 75.6 lbs Visceral Fat Rating : 9    RMR: 1875  Today's Visit #: 3  Starting Date: 08/25/24   Subjective   Chief Complaint: Obesity  Interval History Discussed the use of AI scribe software for clinical note transcription with the patient, who gave verbal consent to proceed.  History of Present Illness [name] is a 48 year old female who presents for medical weight management.  She has lost six pounds since her last office visit by adhering to a 1200 calorie nutrition plan. She tracks her intake, consumes more whole foods, ensures adequate protein and hydration, and exercises four days a week with 25 to 30 minutes of biking and walking. She feels better overall, with looser-fitting clothes and no shortness of breath. She has lost a total of 23 pounds over the past year and five months.  She manages her diet well in challenging environments, choosing healthier options like fruit over sweets. Her taste preferences have changed, finding sweet foods too sweet after reducing simple sugars and processed foods.  She recently started estradiol  and progesterone  patches two weeks ago, which have alleviated hot flashes, especially at night. She is working on improving her sleep, which is disrupted by her husband's early morning routine.  Her blood pressure was noted to be 134/89, but she reports it  being lower during weekends at 128. She takes vitamin D  supplements and feels more energetic. She is motivated to continue her weight management journey and is mindful of her carbohydrate intake to prevent diabetes.     Challenges affecting patient progress: none.    Pharmacotherapy for weight management: She is currently taking no anti-obesity medication.   Assessment and Plan   Treatment Plan For Obesity:  Recommended Dietary Goals  [name] is currently in the action stage of change. As such, her goal is to continue weight management plan. She has agreed to: continue current plan  Behavioral Health and Counseling  We discussed the following behavioral modification strategies today: continue to work on maintaining a reduced calorie state, getting the recommended amount of protein, incorporating whole foods, making healthy choices, staying well hydrated and practicing mindfulness when eating. and increase protein intake, fibrous foods (25 grams per day for women, 30 grams for men) and water to improve satiety and decrease hunger signals. .  Additional education and resources provided today: None  Recommended Physical Activity Goals  [name] has been advised to work up to 150 minutes of moderate intensity aerobic activity a week and strengthening exercises 2-3 times per week for cardiovascular health, weight loss maintenance and preservation of muscle mass.  She has agreed to :  Think about enjoyable ways to increase daily physical activity and overcoming barriers to exercise, Increase physical activity in their day and reduce sedentary time (increase NEAT)., Increase volume of physical activity to a goal of 240 minutes a week, and  Combine aerobic and strengthening exercises for efficiency and improved cardiometabolic health.  Medical Interventions and Pharmacotherapy   Continue with current nutritional and behavioral strategies  Associated Conditions Impacted by Obesity  Treatment  Assessment & Plan Primary hypertension Blood pressure is currently 134/89 mmHg, with noted variations during the workweek and weekends. She reports a lower blood pressure of 128 mmHg over the weekend. The estradiol  patch may contribute to elevated blood pressure, and weight loss is expected to further reduce blood pressure levels. - Monitor blood pressure in the morning and before bedtime to assess for non-dipping patterns - Continue weight loss efforts to aid in blood pressure reduction - Monitor for any side effects from estradiol  patch - Continue hydrochlorothiazide . Insulin  resistance Her HOMA-IR is 4.92 which is elevated. Optimal level < 1.9.   This is complex condition associated with genetics, ectopic fat and lifestyle factors. Insulin  resistance may result in increased fat storage, inhibition of the breakdown of fat, cause fluctuations in blood sugar leading to energy crashes and increased cravings for sugary or high carb foods and cause metabolic slowdown making it difficult to lose weight.  This may result in additional weight gain and lead to pre-diabetes and diabetes if untreated. In addition, hyperinsulinemia increases cardiovascular risk, chronic inflammatory response and may increase the risk of obesity related malignancies.  No results found for: HGBA1C Lab Results  Component Value Date   INSULIN  18.3 08/25/2024   Lab Results  Component Value Date   GLUCOSE 109 (H) 07/14/2024   GLUCOSE 79 11/28/2007    We reviewed treatment options which include losing 7 to 10% of body weight, increasing volume of physical activity and maintaining a diet low in saturated fats and with a low glycemic load.  Patient has also been educated on the carb insulin  model of obesity.  We discussed the benefits and side effects of metformin in the past.  She will like to hold off pharmacoprophylaxis.  Continue current weight management strategy Class 1 obesity with serious comorbidity  and body mass index (BMI) of 33.0 to 33.9 in adult, unspecified obesity type Obesity with a BMI of 31, showing significant improvement with a weight loss of 6 pounds since the last visit and a total of 23 pounds over the past year and five months. She is exercise sensitive, which has contributed to her weight loss. She is adhering well to a 1200 calorie nutrition plan, incorporating whole foods, adequate protein, and hydration, and exercising four days a week. She is making mindful food choices, even in challenging environments, and is experiencing positive changes in taste preferences. - Continue 1200 calorie nutrition plan with focus on whole foods and adequate protein - Maintain exercise routine of four days a week - Encourage mindful eating and making healthy food choices - Monitor weight and BMI at follow-up visits Vitamin D  deficiency On high-dose supplementation without any adverse effects continue vitamin D  supplementation. Prediabetes Prediabetes with fasting glucose of 109 mg/dL and J8r of 3.9%. Elevated insulin  levels at 18.3, indicating insulin  resistance.  She has reduced simple and added sugars in her diet and has also increased volume of physical activity.  She has declined pharmacoprophylaxis with metformin in the past continue current weight management strategy Sleep difficulties Sleep disturbance is primarily due to being awakened early by her husband's schedule. Sleep quality is important for overall health and weight management. - Consider sleeping in a separate room on Wednesday nights to ensure adequate rest - Aim for at least seven hours of sleep per  night          Objective   Physical Exam:  Blood pressure 134/89, pulse 81, temperature 98.2 F (36.8 C), height 5' 6.5 (1.689 m), weight 195 lb (88.5 kg), last menstrual period 09/18/2024, SpO2 99%. Body mass index is 31 kg/m.  General: She is overweight, cooperative, alert, well developed, and in no acute  distress. PSYCH: Has normal mood, affect and thought process.   HEENT: EOMI, sclerae are anicteric. Lungs: Normal breathing effort, no conversational dyspnea. Extremities: No edema.  Neurologic: No gross sensory or motor deficits. No tremors or fasciculations noted.    Diagnostic Data Reviewed:  BMET    Component Value Date/Time   NA 137 07/14/2024 1716   K 3.5 07/14/2024 1716   CL 101 07/14/2024 1716   CO2 22 07/14/2024 1716   GLUCOSE 109 (H) 07/14/2024 1716   BUN 10 07/14/2024 1716   CREATININE 0.94 07/14/2024 1716   CALCIUM 10.2 07/14/2024 1716   GFRNONAA >60 07/14/2024 1716   GFRAA  01/05/2010 0740    >60        The eGFR has been calculated using the MDRD equation. This calculation has not been validated in all clinical situations. eGFR's persistently <60 mL/min signify possible Chronic Kidney Disease.   No results found for: HGBA1C Lab Results  Component Value Date   INSULIN  18.3 08/25/2024   No results found for: TSH CBC    Component Value Date/Time   WBC 10.0 07/14/2024 1716   RBC 4.99 07/14/2024 1716   HGB 14.2 07/14/2024 1716   HCT 41.6 07/14/2024 1716   PLT 312 07/14/2024 1716   MCV 83.4 07/14/2024 1716   MCH 28.5 07/14/2024 1716   MCHC 34.1 07/14/2024 1716   RDW 13.2 07/14/2024 1716   Iron Studies No results found for: IRON, TIBC, FERRITIN, IRONPCTSAT Lipid Panel  No results found for: CHOL, TRIG, HDL, CHOLHDL, VLDL, LDLCALC, LDLDIRECT Hepatic Function Panel     Component Value Date/Time   PROT 5.9 (L) 12/07/2007 0505   ALBUMIN 3.0 (L) 12/07/2007 0505   AST 24 12/07/2007 0505   ALT 34 12/07/2007 0505   ALKPHOS 128 (H) 12/07/2007 0505   BILITOT 0.5 12/07/2007 0505   No results found for: TSH Nutritional Lab Results  Component Value Date   VD25OH 19.5 (L) 08/25/2024    Medications: Outpatient Encounter Medications as of 09/29/2024  Medication Sig   estradiol  (VIVELLE -DOT) 0.025 MG/24HR Place 1 patch onto  the skin 2 (two) times a week.   fluticasone  (FLONASE ) 50 MCG/ACT nasal spray Place 1 spray into both nostrils 2 (two) times daily.   hydrochlorothiazide  (HYDRODIURIL ) 25 MG tablet Take 1 tablet (25 mg total) by mouth daily.   ibuprofen  (ADVIL ) 600 MG tablet Take 1 tablet (600 mg total) by mouth every 6 (six) hours as needed for mild pain.   progesterone  (PROMETRIUM ) 100 MG capsule Take 1 capsule (100 mg total) by mouth daily.   Vitamin D , Ergocalciferol , (DRISDOL ) 1.25 MG (50000 UNIT) CAPS capsule Take 1 capsule (50,000 Units total) by mouth every 7 (seven) days.   No facility-administered encounter medications on file as of 09/29/2024.     Follow-Up   Return in about 4 weeks (around 10/27/2024) for For Weight Mangement with Dr. [email or web address] She was informed of the importance of frequent follow up visits to maximize her success with intensive lifestyle modifications for her multiple health conditions.  Attestation Statement   Reviewed by clinician on day of visit: allergies, medications, problem list, medical history, surgical  history, family history, social history, and previous encounter notes.     Lucas Parker, MD

## 2024-09-29 NOTE — Assessment & Plan Note (Signed)
 On high-dose supplementation without any adverse effects continue vitamin D  supplementation.

## 2024-09-29 NOTE — Assessment & Plan Note (Signed)
 Prediabetes with fasting glucose of 109 mg/dL and J8r of 3.9%. Elevated insulin  levels at 18.3, indicating insulin  resistance.  She has reduced simple and added sugars in her diet and has also increased volume of physical activity.  She has declined pharmacoprophylaxis with metformin in the past continue current weight management strategy

## 2024-09-29 NOTE — Assessment & Plan Note (Signed)
 Her HOMA-IR is 4.92 which is elevated. Optimal level < 1.9.   This is complex condition associated with genetics, ectopic fat and lifestyle factors. Insulin  resistance may result in increased fat storage, inhibition of the breakdown of fat, cause fluctuations in blood sugar leading to energy crashes and increased cravings for sugary or high carb foods and cause metabolic slowdown making it difficult to lose weight.  This may result in additional weight gain and lead to pre-diabetes and diabetes if untreated. In addition, hyperinsulinemia increases cardiovascular risk, chronic inflammatory response and may increase the risk of obesity related malignancies.  No results found for: HGBA1C Lab Results  Component Value Date   INSULIN  18.3 08/25/2024   Lab Results  Component Value Date   GLUCOSE 109 (H) 07/14/2024   GLUCOSE 79 11/28/2007    We reviewed treatment options which include losing 7 to 10% of body weight, increasing volume of physical activity and maintaining a diet low in saturated fats and with a low glycemic load.  Patient has also been educated on the carb insulin  model of obesity.  We discussed the benefits and side effects of metformin in the past.  She will like to hold off pharmacoprophylaxis.  Continue current weight management strategy

## 2024-09-29 NOTE — Assessment & Plan Note (Signed)
 Blood pressure is currently 134/89 mmHg, with noted variations during the workweek and weekends. She reports a lower blood pressure of 128 mmHg over the weekend. The estradiol  patch may contribute to elevated blood pressure, and weight loss is expected to further reduce blood pressure levels. - Monitor blood pressure in the morning and before bedtime to assess for non-dipping patterns - Continue weight loss efforts to aid in blood pressure reduction - Monitor for any side effects from estradiol  patch - Continue hydrochlorothiazide .

## 2024-09-29 NOTE — Assessment & Plan Note (Signed)
 Obesity with a BMI of 31, showing significant improvement with a weight loss of 6 pounds since the last visit and a total of 23 pounds over the past year and five months. She is exercise sensitive, which has contributed to her weight loss. She is adhering well to a 1200 calorie nutrition plan, incorporating whole foods, adequate protein, and hydration, and exercising four days a week. She is making mindful food choices, even in challenging environments, and is experiencing positive changes in taste preferences. - Continue 1200 calorie nutrition plan with focus on whole foods and adequate protein - Maintain exercise routine of four days a week - Encourage mindful eating and making healthy food choices - Monitor weight and BMI at follow-up visits

## 2024-10-04 ENCOUNTER — Ambulatory Visit (INDEPENDENT_AMBULATORY_CARE_PROVIDER_SITE_OTHER): Admitting: Internal Medicine

## 2024-10-11 ENCOUNTER — Other Ambulatory Visit (HOSPITAL_COMMUNITY): Payer: Self-pay

## 2024-10-11 ENCOUNTER — Other Ambulatory Visit: Payer: Self-pay

## 2024-10-18 ENCOUNTER — Ambulatory Visit (INDEPENDENT_AMBULATORY_CARE_PROVIDER_SITE_OTHER): Admitting: Internal Medicine

## 2024-10-18 ENCOUNTER — Encounter (HOSPITAL_BASED_OUTPATIENT_CLINIC_OR_DEPARTMENT_OTHER): Payer: Self-pay

## 2024-10-19 ENCOUNTER — Encounter (HOSPITAL_BASED_OUTPATIENT_CLINIC_OR_DEPARTMENT_OTHER): Payer: Self-pay | Admitting: Cardiovascular Disease

## 2024-10-19 ENCOUNTER — Ambulatory Visit (INDEPENDENT_AMBULATORY_CARE_PROVIDER_SITE_OTHER): Admitting: Cardiovascular Disease

## 2024-10-19 VITALS — BP 122/76 | HR 80 | Ht 67.0 in | Wt 197.9 lb

## 2024-10-19 DIAGNOSIS — I10 Essential (primary) hypertension: Secondary | ICD-10-CM | POA: Diagnosis not present

## 2024-10-19 DIAGNOSIS — E88819 Insulin resistance, unspecified: Secondary | ICD-10-CM | POA: Diagnosis not present

## 2024-10-19 DIAGNOSIS — E7800 Pure hypercholesterolemia, unspecified: Secondary | ICD-10-CM

## 2024-10-19 NOTE — Progress Notes (Signed)
 Cardiology Office Note:  .   Date:  10/19/2024  ID:  [name], DOB Jun 07, 1976, MRN [id number] PCP: Vernadine Charlie ORN, MD  [facility] Providers Cardiologist:  None    History of Present Illness: .    [name] is a 48 y.o. female with hypertension, preeclampsia, obesity and chest pain here for evaluation of chest pain.  She was seen in the ED 06/2024 for chest pain.  Cardiac enzymes and D-dimer were negative.   Discussed the use of AI scribe software for clinical note transcription with the patient, who gave verbal consent to proceed.  History of Present Illness [name] is a 48 year old female with hypertension who presents with chest pain.  In July, she experienced chest pain located under her breasts with radiation. Initially, she suspected it was related to GERD due to a persistent cough. Her OB GYN recommended Nexium, which she took for two weeks, resolving the cough and GERD symptoms. Despite this, she visited the emergency department due to concerns about cardiac issues.  She has been actively working on lifestyle modifications to improve her health. Since May, she has lost 23 pounds by participating in a healthy weight and wellness program. She walks around the hospital two to three days a week and has stopped drinking sodas, opting for water instead. She is focused on reducing her cholesterol and A1c levels, with the latter being elevated and due for re-evaluation in December.  Her past medical history includes hypertension, for which she has been on hydrochlorothiazide  since 2008 following the birth of her son, when she experienced preeclampsia and required a magnesium drip post-delivery. She has a history of miscarriages but has one living child, a 48 year old son.  No chest pain with exertion, palpitations, or swelling in her legs or feet.   ROS:  As per HPI  Studies Reviewed: .       N/a  Risk Assessment/Calculations:              Physical Exam:   VS:  BP 122/76 (BP Location: Right Arm, Patient Position: Sitting, Cuff Size: Large)   Pulse 80   Ht 5' 7 (1.702 m)   Wt 197 lb 14.4 oz (89.8 kg)   LMP 09/18/2024 (Exact Date)   SpO2 97%   BMI 31.00 kg/m  , BMI Body mass index is 31 kg/m. GENERAL:  Well appearing HEENT: Pupils equal round and reactive, fundi not visualized, oral mucosa unremarkable NECK:  No jugular venous distention, waveform within normal limits, carotid upstroke brisk and symmetric, no bruits, no thyromegaly LUNGS:  Clear to auscultation bilaterally HEART:  RRR.  PMI not displaced or sustained,S1 and S2 within normal limits, no S3, no S4, no clicks, no rubs, no murmurs ABD:  Flat, positive bowel sounds normal in frequency in pitch, no bruits, no rebound, no guarding, no midline pulsatile mass, no hepatomegaly, no splenomegaly EXT:  2 plus pulses throughout, no edema, no cyanosis no clubbing SKIN:  No rashes no nodules NEURO:  Cranial nerves II through XII grossly intact, motor grossly intact throughout PSYCH:  Cognitively intact, oriented to person place and time   ASSESSMENT AND PLAN: .    Assessment & Plan # Chest Pain Resolved chest pain in July, likely GERD or costochondritis. No exertional symptoms. Low cardiac risk. - Monitor for recurrence, especially with exertion. - Consider coronary calcium score for further risk assessment if desired.  # Hypertension Blood pressure well-controlled. Hypertension since 2008, related to preeclampsia. -  Encourage lifestyle modifications to maintain blood pressure control.  # Hyperlipidemia Cholesterol levels improved. Total cholesterol 150 mg/dL. No immediate medication needed. - Continue lifestyle modifications to maintain cholesterol levels. - Consider coronary calcium score for further risk assessment if desired.  # Elevated Hemoglobin A1c A1c elevated. Lifestyle changes in progress. Follow-up test in December. - Continue  lifestyle modifications to reduce A1c. - Recheck A1c in December.  # Obesity Ms. Graves-Bigelow lost 23 pounds since May through lifestyle modifications. Actively managing weight. - Continue with Healthy Weight and Wellness program. - Encourage regular exercise, aiming for 150 minutes per week.         Dispo: f/u in 1 year  Signed, Annabella Scarce, MD

## 2024-10-19 NOTE — Patient Instructions (Signed)
 Medication Instructions:  Your physician recommends that you continue on your current medications as directed. Please refer to the Current Medication list given to you today.   *If you need a refill on your cardiac medications before your next appointment, please call your pharmacy*  Lab Work: NONE  Testing/Procedures: NONE  Follow-Up: At [facility], you and your health needs are our priority.  As part of our continuing mission to provide you with exceptional heart care, our providers are all part of one team.  This team includes your primary Cardiologist (physician) and Advanced Practice Providers or APPs (Physician Assistants and Nurse Practitioners) who all work together to provide you with the care you need, when you need it.  Your next appointment:   12 month(s)  Provider:   Chilton Si, MD, Eligha Bridegroom, NP, or Gillian Shields, NP    We recommend signing up for the patient portal called "MyChart".  Sign up information is provided on this After Visit Summary.  MyChart is used to connect with patients for Virtual Visits (Telemedicine).  Patients are able to view lab/test results, encounter notes, upcoming appointments, etc.  Non-urgent messages can be sent to your provider as well.   To learn more about what you can do with MyChart, go to [email or web address].

## 2024-10-20 ENCOUNTER — Encounter (INDEPENDENT_AMBULATORY_CARE_PROVIDER_SITE_OTHER): Payer: Self-pay | Admitting: Internal Medicine

## 2024-10-20 ENCOUNTER — Ambulatory Visit (INDEPENDENT_AMBULATORY_CARE_PROVIDER_SITE_OTHER): Admitting: Internal Medicine

## 2024-10-20 VITALS — BP 134/86 | HR 72 | Temp 98.3°F | Ht 66.5 in | Wt 193.0 lb

## 2024-10-20 DIAGNOSIS — E88819 Insulin resistance, unspecified: Secondary | ICD-10-CM

## 2024-10-20 DIAGNOSIS — Z6833 Body mass index (BMI) 33.0-33.9, adult: Secondary | ICD-10-CM

## 2024-10-20 DIAGNOSIS — E66811 Obesity, class 1: Secondary | ICD-10-CM

## 2024-10-20 DIAGNOSIS — R7303 Prediabetes: Secondary | ICD-10-CM

## 2024-10-20 DIAGNOSIS — I10 Essential (primary) hypertension: Secondary | ICD-10-CM | POA: Diagnosis not present

## 2024-10-20 NOTE — Progress Notes (Signed)
 Office: [phone]  /  Fax: [phone]  Weight Summary and Body Composition Analysis (BIA)  Vitals Temp: 98.3 F (36.8 C) BP: 134/86 Pulse Rate: 72 SpO2: 100 %   Anthropometric Measurements Height: 5' 6.5 (1.689 m) Weight: 193 lb (87.5 kg) BMI (Calculated): 30.69 Weight at Last Visit: 195 lb Weight Lost Since Last Visit: 2 lb Weight Gained Since Last Visit: 0 lb Starting Weight: 205 lb Total Weight Loss (lbs): 12 lb (5.443 kg) Peak Weight: 219 lb   Body Composition  Body Fat %: 39.3 % Fat Mass (lbs): 76 lbs Muscle Mass (lbs): 111.6 lbs Total Body Water (lbs): 75.6 lbs Visceral Fat Rating : 9    RMR: 1875  Today's Visit #: 4  Starting Date: 08/25/24   Subjective   Chief Complaint: Obesity  Interval History Discussed the use of AI scribe software for clinical note transcription with the patient, who gave verbal consent to proceed.  History of Present Illness [name] is a 48 year old female with obesity who presents for a weight management follow-up.  She has experienced a recent lapse in dietary habits due to social events, consuming foods like a large donut and chicken tacos, which deviate from her usual eating pattern. Despite this, she has lost two pounds since her last visit. She maintains regular water intake and is mindful of her protein consumption, which helps her feel satiated and reduces snacking. Occasionally, she consumes a 100-calorie snack at work, such as [employer], to satisfy her craving for crunch.  Her physical activity primarily consists of walking, as she has not been using her bike recently. She acknowledges that the intensity of her walking is not as high as her previous biking routine. Despite these changes, her clothes are fitting looser, indicating a change in body composition.  She is focused on continuing her weight loss journey and is mindful of upcoming challenges, such as Halloween, which may present  temptations.  She feels better overall, attributing this to her increased water intake and physical activity. She feels less sluggish, which she partially attributes to vitamin D  supplementation.  She is currently consuming three meals a day without skipping, ensuring adequate protein intake to maintain satiety. She occasionally uses frozen meals to manage her diet when she tires of her usual lunch options, such as [place] sandwiches.     Challenges affecting patient progress: none.    Pharmacotherapy for weight management: She is currently taking no anti-obesity medication.   Assessment and Plan   Treatment Plan For Obesity:  Recommended Dietary Goals  [name] is currently in the action stage of change. As such, her goal is to continue weight management plan. She has agreed to: incorporate prepackaged healthy meals for convenience, incorporate 1-2 meal replacements a day for convenience , and continue current plan  Behavioral Health and Counseling  We discussed the following behavioral modification strategies today: continue to work on maintaining a reduced calorie state, getting the recommended amount of protein, incorporating whole foods, making healthy choices, staying well hydrated and practicing mindfulness when eating. and increase protein intake, fibrous foods (25 grams per day for women, 30 grams for men) and water to improve satiety and decrease hunger signals. .  Additional education and resources provided today: None  Recommended Physical Activity Goals  [name] has been advised to work up to 150 minutes of moderate intensity aerobic activity a week and strengthening exercises 2-3 times per week for cardiovascular health, weight loss maintenance and preservation of muscle mass.  She has agreed to :  Increase volume of physical activity to a goal of 240 minutes a week and Combine aerobic and strengthening exercises for efficiency and improved cardiometabolic  health.  Medical Interventions and Pharmacotherapy  We discussed various medication options to help [name] with her weight loss efforts and we both agreed to : Continue with current nutritional and behavioral strategies  Associated Conditions Impacted by Obesity Treatment  Assessment & Plan Primary hypertension Blood pressure today slightly above target of 130/80.  She is on hydrochlorothiazide  for weight.  Continue monitoring for now.  She has also reduced processed foods and therefore sodium. Insulin  resistance Her HOMA-IR is 4.92 which is elevated. Optimal level < 1.9.   This is complex condition associated with genetics, ectopic fat and lifestyle factors. Insulin  resistance may result in increased fat storage, inhibition of the breakdown of fat, cause fluctuations in blood sugar leading to energy crashes and increased cravings for sugary or high carb foods and cause metabolic slowdown making it difficult to lose weight.  This may result in additional weight gain and lead to pre-diabetes and diabetes if untreated. In addition, hyperinsulinemia increases cardiovascular risk, chronic inflammatory response and may increase the risk of obesity related malignancies.  No results found for: HGBA1C Lab Results  Component Value Date   INSULIN  18.3 08/25/2024   Lab Results  Component Value Date   GLUCOSE 109 (H) 07/14/2024   GLUCOSE 79 11/28/2007   She continues to reduce simple and added sugars in her diet.  We had discussed the benefits and side effects of metformin in the past.  She will like to hold off pharmacoprophylaxis.  Continue current weight management strategy Class 1 obesity with serious comorbidity and body mass index (BMI) of 33.0 to 33.9 in adult, unspecified obesity type Obesity with a BMI of 30, reduced from 33 in May 2024. Body fat percentage is decreasing, with a target of under 34%. Current weight loss is gradual due to lower fat mass. Recent dietary lapses occurred  due to social events, but overall progress is positive with a 2-pound weight loss. Physical activity is maintained through walking, though intensity decreased without cycling. Appetite control is good with adequate protein intake. Transitioning from weight loss to maintenance phase, requiring increased physical activity to prevent metabolic slowdown. Genetic factors necessitate gradual weight loss and consistency in dietary habits. - Encourage resumption of cycling to increase physical activity intensity. - Advise maintaining 240 minutes of physical activity per week for weight maintenance. - Discuss strategies for managing dietary lapses and maintaining consistency. - Suggest trying roasted chickpeas or edamame for healthy snacks. - Recommend exploring [employer] for convenient healthy meals. Prediabetes Prediabetes with fasting glucose of 109 mg/dL and J8r of 3.9%. Elevated insulin  levels at 18.3, indicating insulin  resistance.  She has reduced simple and added sugars in her diet and has also increased volume of physical activity.  She has declined pharmacoprophylaxis with metformin in the past continue current weight management strategy         Objective   Physical Exam:  Blood pressure 134/86, pulse 72, temperature 98.3 F (36.8 C), height 5' 6.5 (1.689 m), weight 193 lb (87.5 kg), last menstrual period 09/18/2024, SpO2 100%. Body mass index is 30.68 kg/m.  General: She is overweight, cooperative, alert, well developed, and in no acute distress. PSYCH: Has normal mood, affect and thought process.   HEENT: EOMI, sclerae are anicteric. Lungs: Normal breathing effort, no conversational dyspnea. Extremities: No edema.  Neurologic: No  gross sensory or motor deficits. No tremors or fasciculations noted.    Diagnostic Data Reviewed:  BMET    Component Value Date/Time   NA 137 07/14/2024 1716   K 3.5 07/14/2024 1716   CL 101 07/14/2024 1716   CO2 22 07/14/2024 1716    GLUCOSE 109 (H) 07/14/2024 1716   BUN 10 07/14/2024 1716   CREATININE 0.94 07/14/2024 1716   CALCIUM 10.2 07/14/2024 1716   GFRNONAA >60 07/14/2024 1716   GFRAA  01/05/2010 0740    >60        The eGFR has been calculated using the MDRD equation. This calculation has not been validated in all clinical situations. eGFR's persistently <60 mL/min signify possible Chronic Kidney Disease.   No results found for: HGBA1C Lab Results  Component Value Date   INSULIN  18.3 08/25/2024   No results found for: TSH CBC    Component Value Date/Time   WBC 10.0 07/14/2024 1716   RBC 4.99 07/14/2024 1716   HGB 14.2 07/14/2024 1716   HCT 41.6 07/14/2024 1716   PLT 312 07/14/2024 1716   MCV 83.4 07/14/2024 1716   MCH 28.5 07/14/2024 1716   MCHC 34.1 07/14/2024 1716   RDW 13.2 07/14/2024 1716   Iron Studies No results found for: IRON, TIBC, FERRITIN, IRONPCTSAT Lipid Panel  No results found for: CHOL, TRIG, HDL, CHOLHDL, VLDL, LDLCALC, LDLDIRECT Hepatic Function Panel     Component Value Date/Time   PROT 5.9 (L) 12/07/2007 0505   ALBUMIN 3.0 (L) 12/07/2007 0505   AST 24 12/07/2007 0505   ALT 34 12/07/2007 0505   ALKPHOS 128 (H) 12/07/2007 0505   BILITOT 0.5 12/07/2007 0505   No results found for: TSH Nutritional Lab Results  Component Value Date   VD25OH 19.5 (L) 08/25/2024    Medications: Outpatient Encounter Medications as of 10/20/2024  Medication Sig   estradiol  (VIVELLE -DOT) 0.025 MG/24HR Place 1 patch onto the skin 2 (two) times a week.   fluticasone  (FLONASE ) 50 MCG/ACT nasal spray Place 1 spray into both nostrils 2 (two) times daily.   hydrochlorothiazide  (HYDRODIURIL ) 25 MG tablet Take 1 tablet (25 mg total) by mouth daily.   ibuprofen  (ADVIL ) 600 MG tablet Take 1 tablet (600 mg total) by mouth every 6 (six) hours as needed for mild pain.   progesterone  (PROMETRIUM ) 100 MG capsule Take 1 capsule (100 mg total) by mouth daily.   Vitamin D ,  Ergocalciferol , (DRISDOL ) 1.25 MG (50000 UNIT) CAPS capsule Take 1 capsule (50,000 Units total) by mouth every 7 (seven) days.   No facility-administered encounter medications on file as of 10/20/2024.     Follow-Up   Return in about 4 weeks (around 11/17/2024).SABRA She was informed of the importance of frequent follow up visits to maximize her success with intensive lifestyle modifications for her multiple health conditions.  Attestation Statement   Reviewed by clinician on day of visit: allergies, medications, problem list, medical history, surgical history, family history, social history, and previous encounter notes.     Lucas Parker, MD

## 2024-10-20 NOTE — Assessment & Plan Note (Signed)
 Blood pressure today slightly above target of 130/80.  She is on hydrochlorothiazide  for weight.  Continue monitoring for now.  She has also reduced processed foods and therefore sodium.

## 2024-10-20 NOTE — Assessment & Plan Note (Signed)
 Prediabetes with fasting glucose of 109 mg/dL and J8r of 3.9%. Elevated insulin  levels at 18.3, indicating insulin  resistance.  She has reduced simple and added sugars in her diet and has also increased volume of physical activity.  She has declined pharmacoprophylaxis with metformin in the past continue current weight management strategy

## 2024-10-20 NOTE — Assessment & Plan Note (Signed)
 Her HOMA-IR is 4.92 which is elevated. Optimal level < 1.9.   This is complex condition associated with genetics, ectopic fat and lifestyle factors. Insulin  resistance may result in increased fat storage, inhibition of the breakdown of fat, cause fluctuations in blood sugar leading to energy crashes and increased cravings for sugary or high carb foods and cause metabolic slowdown making it difficult to lose weight.  This may result in additional weight gain and lead to pre-diabetes and diabetes if untreated. In addition, hyperinsulinemia increases cardiovascular risk, chronic inflammatory response and may increase the risk of obesity related malignancies.  No results found for: HGBA1C Lab Results  Component Value Date   INSULIN  18.3 08/25/2024   Lab Results  Component Value Date   GLUCOSE 109 (H) 07/14/2024   GLUCOSE 79 11/28/2007   She continues to reduce simple and added sugars in her diet.  We had discussed the benefits and side effects of metformin in the past.  She will like to hold off pharmacoprophylaxis.  Continue current weight management strategy

## 2024-10-20 NOTE — Assessment & Plan Note (Signed)
 Obesity with a BMI of 30, reduced from 33 in May 2024. Body fat percentage is decreasing, with a target of under 34%. Current weight loss is gradual due to lower fat mass. Recent dietary lapses occurred due to social events, but overall progress is positive with a 2-pound weight loss. Physical activity is maintained through walking, though intensity decreased without cycling. Appetite control is good with adequate protein intake. Transitioning from weight loss to maintenance phase, requiring increased physical activity to prevent metabolic slowdown. Genetic factors necessitate gradual weight loss and consistency in dietary habits. - Encourage resumption of cycling to increase physical activity intensity. - Advise maintaining 240 minutes of physical activity per week for weight maintenance. - Discuss strategies for managing dietary lapses and maintaining consistency. - Suggest trying roasted chickpeas or edamame for healthy snacks. - Recommend exploring [employer] for convenient healthy meals.

## 2024-11-02 ENCOUNTER — Encounter (HOSPITAL_BASED_OUTPATIENT_CLINIC_OR_DEPARTMENT_OTHER): Payer: Self-pay | Admitting: Cardiovascular Disease

## 2024-11-17 ENCOUNTER — Ambulatory Visit (INDEPENDENT_AMBULATORY_CARE_PROVIDER_SITE_OTHER): Payer: Self-pay | Admitting: Internal Medicine

## 2024-11-17 ENCOUNTER — Encounter (INDEPENDENT_AMBULATORY_CARE_PROVIDER_SITE_OTHER): Payer: Self-pay | Admitting: Internal Medicine

## 2024-11-17 VITALS — BP 118/79 | HR 98 | Temp 98.9°F | Ht 66.5 in | Wt 189.0 lb

## 2024-11-17 DIAGNOSIS — E559 Vitamin D deficiency, unspecified: Secondary | ICD-10-CM | POA: Diagnosis not present

## 2024-11-17 DIAGNOSIS — R7303 Prediabetes: Secondary | ICD-10-CM

## 2024-11-17 DIAGNOSIS — I10 Essential (primary) hypertension: Secondary | ICD-10-CM | POA: Diagnosis not present

## 2024-11-17 DIAGNOSIS — Z6830 Body mass index (BMI) 30.0-30.9, adult: Secondary | ICD-10-CM

## 2024-11-17 DIAGNOSIS — E66811 Obesity, class 1: Secondary | ICD-10-CM | POA: Diagnosis not present

## 2024-11-17 DIAGNOSIS — E88819 Insulin resistance, unspecified: Secondary | ICD-10-CM

## 2024-11-17 NOTE — Assessment & Plan Note (Signed)
 Previously treated with high-dose vitamin D  for four months. Current vitamin D  level was 19.5. Transition to over-the-counter vitamin D3 is recommended to maintain levels without risk of overcorrection and potential vitamin D  poisoning. - Transition to over-the-counter vitamin D3, 2000 IU daily.

## 2024-11-17 NOTE — Assessment & Plan Note (Signed)
 Prediabetes with fasting glucose of 109 mg/dL and J8r of 3.9%. Elevated insulin  levels at 18.3, indicating insulin  resistance.  She has reduced simple and added sugars in her diet and has also increased volume of physical activity.  She has declined pharmacoprophylaxis with metformin in the past continue current weight management strategy

## 2024-11-17 NOTE — Assessment & Plan Note (Signed)
 Vitals:   11/17/24 1600  BP: 118/79    Blood pressure is at goal for age and risk category.  On hydrochlorothiazide  without adverse effects.  Most recent renal parameters reviewed which showed stable electrolytes and kidney function.  Continue with weight loss therapy. Losing 10% may improve blood pressure control. Monitor for symptoms of orthostasis while losing weight. Continue current regimen and home monitoring for a goal blood pressure of < 120/80.

## 2024-11-17 NOTE — Progress Notes (Signed)
 Office: [phone]  /  Fax: [phone]  Weight Summary and Body Composition Analysis (BIA)  Vitals Temp: 98.9 F (37.2 C) BP: 118/79 Pulse Rate: 98 SpO2: 99 %   Anthropometric Measurements Height: 5' 6.5 (1.689 m) Weight: 189 lb (85.7 kg) BMI (Calculated): 30.05 Weight at Last Visit: 193 lb Weight Lost Since Last Visit: 4 lb Weight Gained Since Last Visit: 0 lb Starting Weight: 205 lb Total Weight Loss (lbs): 16 lb (7.258 kg) Peak Weight: 219 lb   Body Composition  Body Fat %: 38.6 % Fat Mass (lbs): 73.2 lbs Muscle Mass (lbs): 110.4 lbs Total Body Water (lbs): 76 lbs Visceral Fat Rating : 9    RMR: 1875  Today's Visit #: 5  Starting Date: 08/25/24   Subjective   Chief Complaint: Obesity  Interval History Discussed the use of AI scribe software for clinical note transcription with the patient, who gave verbal consent to proceed.  History of Present Illness [name] is a 48 year old female who presents for medical weight management.  She has lost four pounds since her last visit and adheres to a 1200 calorie nutrition plan approximately 80% of the time. 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  Her body fat percentage has decreased from 41.7% to 38%, and she has moved from a size 14 to a size 12 in clothing. Since August, she has lost a total of 16 pounds, with her BMI decreasing from 34.9 in May to 30 currently. Her goal is to reduce her body fat percentage to below 34%.  She tries not to weigh herself until her appointments and feels she could improve her efforts, though she acknowledges she has not done poorly. She aims to make healthy choices, focusing on nutrient-dense foods and avoiding processed options. She is considering strategies to maintain her progress during the upcoming holidays, such as not skipping meals before celebrations and being  mindful of portion sizes.  She has been taking high-dose vitamin D  for four months, with only a few pills remaining. Her initial vitamin D  level was 19.5. She also has a history of insulin  resistance.     Challenges affecting patient progress: multiple competing priorities and work schedule.    Pharmacotherapy for weight management: She is currently taking no anti-obesity medication.   Assessment and Plan   Treatment Plan For Obesity:  Recommended Dietary Goals  [name] is currently in the action stage of change. As such, her goal is to continue weight management plan. She has agreed to: continue current plan  Behavioral Health and Counseling  We discussed the following behavioral modification strategies today: continue to work on maintaining a reduced calorie state, getting the recommended amount of protein, incorporating whole foods, making healthy choices, staying well hydrated and practicing mindfulness when eating. and increase protein intake, fibrous foods (25 grams per day for women, 30 grams for men) and water to improve satiety and decrease hunger signals. .  Additional education and resources provided today: Handout on traveling and holiday eating strategies  Recommended Physical Activity Goals  [name] has been advised to work up to 150 minutes of moderate intensity aerobic activity a week and strengthening exercises 2-3 times per week for cardiovascular health, weight loss maintenance and preservation of muscle mass.  She has agreed to :  Think about enjoyable ways to increase daily physical activity and overcoming barriers to exercise, Increase physical activity in their day and  reduce sedentary time (increase NEAT)., Increase volume of physical activity to a goal of 240 minutes a week, and Combine aerobic and strengthening exercises for efficiency and improved cardiometabolic health.  Medical Interventions and Pharmacotherapy  We discussed various medication options to  help [name] with her weight loss efforts and we both agreed to : Continue with current nutritional and behavioral strategies  Associated Conditions Impacted by Obesity Treatment  Assessment & Plan Class 1 obesity with serious comorbidity and body mass index (BMI) of 30.0 to 30.9 in adult, unspecified obesity type Management is ongoing with a focus on gradual weight loss. She has lost 16 pounds since August, with a current BMI of 30, down from 34.9 in May. Body fat percentage has decreased from 41.7% to 38%, with a goal to reduce it below 34%. She follows a 1200 calorie nutrition plan 80% of the time and has not been exercising due to work schedule constraints. Progress is satisfactory, with a goal of losing 1-2 pounds per week to avoid metabolic slowdown and muscle loss. - Continue 1200 calorie nutrition plan. - Encouraged gradual weight loss of 1-2 pounds per week. - Provided handout on holiday eating strategies. - Encouraged physical activity, such as walking after meals. - Discussed potential use of meal replacements or prepackaged meals. Prediabetes Prediabetes with fasting glucose of 109 mg/dL and J8r of 3.9%. Elevated insulin  levels at 18.3, indicating insulin  resistance.  She has reduced simple and added sugars in her diet and has also increased volume of physical activity.  She has declined pharmacoprophylaxis with metformin in the past continue current weight management strategy Primary hypertension Vitals:   11/17/24 1600  BP: 118/79    Blood pressure is at goal for age and risk category.  On hydrochlorothiazide  without adverse effects.  Most recent renal parameters reviewed which showed stable electrolytes and kidney function.  Continue with weight loss therapy. Losing 10% may improve blood pressure control. Monitor for symptoms of orthostasis while losing weight. Continue current regimen and home monitoring for a goal blood pressure of < 120/80.   Insulin  resistance Her HOMA-IR is  4.92 which is elevated. Optimal level < 1.9.   This is complex condition associated with genetics, ectopic fat and lifestyle factors. Insulin  resistance may result in increased fat storage, inhibition of the breakdown of fat, cause fluctuations in blood sugar leading to energy crashes and increased cravings for sugary or high carb foods and cause metabolic slowdown making it difficult to lose weight.  This may result in additional weight gain and lead to pre-diabetes and diabetes if untreated. In addition, hyperinsulinemia increases cardiovascular risk, chronic inflammatory response and may increase the risk of obesity related malignancies.  No results found for: HGBA1C Lab Results  Component Value Date   INSULIN  18.3 08/25/2024   Lab Results  Component Value Date   GLUCOSE 109 (H) 07/14/2024   GLUCOSE 79 11/28/2007   She continues to reduce simple and added sugars in her diet.  We had discussed the benefits and side effects of metformin in the past.  She will like to hold off pharmacoprophylaxis.  Continue current weight management strategy Vitamin D  deficiency Previously treated with high-dose vitamin D  for four months. Current vitamin D  level was 19.5. Transition to over-the-counter vitamin D3 is recommended to maintain levels without risk of overcorrection and potential vitamin D  poisoning. - Transition to over-the-counter vitamin D3, 2000 IU daily.    Check disease monitoring labs in February to reevaluate glucose metabolism      Objective  Physical Exam:  Blood pressure 118/79, pulse 98, temperature 98.9 F (37.2 C), height 5' 6.5 (1.689 m), weight 189 lb (85.7 kg), last menstrual period 10/16/2024, SpO2 99%. Body mass index is 30.05 kg/m.  General: She is overweight, cooperative, alert, well developed, and in no acute distress. PSYCH: Has normal mood, affect and thought process.   HEENT: EOMI, sclerae are anicteric. Lungs: Normal breathing effort, no conversational  dyspnea. Extremities: No edema.  Neurologic: No gross sensory or motor deficits. No tremors or fasciculations noted.    Diagnostic Data Reviewed:  BMET    Component Value Date/Time   NA 137 07/14/2024 1716   K 3.5 07/14/2024 1716   CL 101 07/14/2024 1716   CO2 22 07/14/2024 1716   GLUCOSE 109 (H) 07/14/2024 1716   BUN 10 07/14/2024 1716   CREATININE 0.94 07/14/2024 1716   CALCIUM 10.2 07/14/2024 1716   GFRNONAA >60 07/14/2024 1716   GFRAA  01/05/2010 0740    >60        The eGFR has been calculated using the MDRD equation. This calculation has not been validated in all clinical situations. eGFR's persistently <60 mL/min signify possible Chronic Kidney Disease.   No results found for: HGBA1C Lab Results  Component Value Date   INSULIN  18.3 08/25/2024   No results found for: TSH CBC    Component Value Date/Time   WBC 10.0 07/14/2024 1716   RBC 4.99 07/14/2024 1716   HGB 14.2 07/14/2024 1716   HCT 41.6 07/14/2024 1716   PLT 312 07/14/2024 1716   MCV 83.4 07/14/2024 1716   MCH 28.5 07/14/2024 1716   MCHC 34.1 07/14/2024 1716   RDW 13.2 07/14/2024 1716   Iron Studies No results found for: IRON, TIBC, FERRITIN, IRONPCTSAT Lipid Panel  No results found for: CHOL, TRIG, HDL, CHOLHDL, VLDL, LDLCALC, LDLDIRECT Hepatic Function Panel     Component Value Date/Time   PROT 5.9 (L) 12/07/2007 0505   ALBUMIN 3.0 (L) 12/07/2007 0505   AST 24 12/07/2007 0505   ALT 34 12/07/2007 0505   ALKPHOS 128 (H) 12/07/2007 0505   BILITOT 0.5 12/07/2007 0505   No results found for: TSH Nutritional Lab Results  Component Value Date   VD25OH 19.5 (L) 08/25/2024    Medications: Outpatient Encounter Medications as of 11/17/2024  Medication Sig   estradiol  (VIVELLE -DOT) 0.025 MG/24HR Place 1 patch onto the skin 2 (two) times a week.   fluticasone  (FLONASE ) 50 MCG/ACT nasal spray Place 1 spray into both nostrils 2 (two) times daily.    hydrochlorothiazide  (HYDRODIURIL ) 25 MG tablet Take 1 tablet (25 mg total) by mouth daily.   ibuprofen  (ADVIL ) 600 MG tablet Take 1 tablet (600 mg total) by mouth every 6 (six) hours as needed for mild pain.   progesterone  (PROMETRIUM ) 100 MG capsule Take 1 capsule (100 mg total) by mouth daily.   Vitamin D , Ergocalciferol , (DRISDOL ) 1.25 MG (50000 UNIT) CAPS capsule Take 1 capsule (50,000 Units total) by mouth every 7 (seven) days.   No facility-administered encounter medications on file as of 11/17/2024.     Follow-Up   Return in about 4 weeks (around 12/15/2024) for For Weight Mangement with Dr. Francyne.SABRA She was informed of the importance of frequent follow up visits to maximize her success with intensive lifestyle modifications for her multiple health conditions.  Attestation Statement   Reviewed by clinician on day of visit: allergies, medications, problem list, medical history, surgical history, family history, social history, and previous encounter notes.  Lucas Parker, MD

## 2024-11-17 NOTE — Assessment & Plan Note (Signed)
 Her HOMA-IR is 4.92 which is elevated. Optimal level < 1.9.   This is complex condition associated with genetics, ectopic fat and lifestyle factors. Insulin  resistance may result in increased fat storage, inhibition of the breakdown of fat, cause fluctuations in blood sugar leading to energy crashes and increased cravings for sugary or high carb foods and cause metabolic slowdown making it difficult to lose weight.  This may result in additional weight gain and lead to pre-diabetes and diabetes if untreated. In addition, hyperinsulinemia increases cardiovascular risk, chronic inflammatory response and may increase the risk of obesity related malignancies.  No results found for: HGBA1C Lab Results  Component Value Date   INSULIN  18.3 08/25/2024   Lab Results  Component Value Date   GLUCOSE 109 (H) 07/14/2024   GLUCOSE 79 11/28/2007   She continues to reduce simple and added sugars in her diet.  We had discussed the benefits and side effects of metformin in the past.  She will like to hold off pharmacoprophylaxis.  Continue current weight management strategy

## 2024-12-14 ENCOUNTER — Encounter (INDEPENDENT_AMBULATORY_CARE_PROVIDER_SITE_OTHER): Payer: Self-pay | Admitting: Internal Medicine

## 2024-12-14 ENCOUNTER — Ambulatory Visit (INDEPENDENT_AMBULATORY_CARE_PROVIDER_SITE_OTHER): Payer: Self-pay | Admitting: Internal Medicine

## 2024-12-14 VITALS — BP 135/89 | HR 86 | Temp 98.3°F | Ht 66.5 in | Wt 191.0 lb

## 2024-12-14 DIAGNOSIS — E88819 Insulin resistance, unspecified: Secondary | ICD-10-CM

## 2024-12-14 DIAGNOSIS — R7303 Prediabetes: Secondary | ICD-10-CM | POA: Diagnosis not present

## 2024-12-14 DIAGNOSIS — E66811 Obesity, class 1: Secondary | ICD-10-CM

## 2024-12-14 DIAGNOSIS — I10 Essential (primary) hypertension: Secondary | ICD-10-CM | POA: Diagnosis not present

## 2024-12-14 DIAGNOSIS — Z6830 Body mass index (BMI) 30.0-30.9, adult: Secondary | ICD-10-CM | POA: Diagnosis not present

## 2024-12-14 NOTE — Assessment & Plan Note (Signed)
 Her HOMA-IR is 4.92 which is elevated. Optimal level < 1.9.   This is complex condition associated with genetics, ectopic fat and lifestyle factors. Insulin  resistance may result in increased fat storage, inhibition of the breakdown of fat, cause fluctuations in blood sugar leading to energy crashes and increased cravings for sugary or high carb foods and cause metabolic slowdown making it difficult to lose weight.  This may result in additional weight gain and lead to pre-diabetes and diabetes if untreated. In addition, hyperinsulinemia increases cardiovascular risk, chronic inflammatory response and may increase the risk of obesity related malignancies.  Her most recent A1c was down to 5.5 and improved  No results found for: HGBA1C Lab Results  Component Value Date   INSULIN  18.3 08/25/2024   Lab Results  Component Value Date   GLUCOSE 109 (H) 07/14/2024   GLUCOSE 79 11/28/2007   She will continue to work on reducing simple and added sugars in her diet and increasing physical activity.  She has declined pharmacoprophylaxis with metformin.

## 2024-12-14 NOTE — Progress Notes (Signed)
 Office: [phone]  /  Fax: [phone]  Weight Summary and Body Composition Analysis (BIA)  Vitals Temp: 98.3 F (36.8 C) BP: 135/89 Pulse Rate: 86 SpO2: 100 %   Anthropometric Measurements Height: 5' 6.5 (1.689 m) Weight: 191 lb (86.6 kg) BMI (Calculated): 30.37 Weight at Last Visit: 189 lb Weight Lost Since Last Visit: 0 lb Weight Gained Since Last Visit: 2 lb Starting Weight: 205 lb Total Weight Loss (lbs): 14 lb (6.35 kg) Peak Weight: 219 lb   Body Composition  Body Fat %: 40.3 % Fat Mass (lbs): 77 lbs Muscle Mass (lbs): 108.2 lbs Total Body Water (lbs): 76.2 lbs Visceral Fat Rating : 9    RMR: 1875  Today's Visit #: 6  Starting Date: 08/25/24   Subjective   Chief Complaint: Obesity  Interval History  Discussed the use of AI scribe software for clinical note transcription with the patient, who gave verbal consent to proceed.  History of Present Illness [name] is a 48 year old female who presents for medical weight management.  She has gained two pounds since her last visit, despite adhering to a 1200 calorie diet. Her physical activity has decreased, with 85% of the time not exercising. She attributes the weight gain to recent events such as her son's birthday, her sister's birthday, and a wedding, where she consumed cake, pudding, and barbecue-flavored Cheetos. She experiences cravings for salty foods, particularly during her menstrual cycle, and admits to not drinking enough water, which she believes contributes to her cravings for sodium.  She reports that her A1c levels have improved. She has previously lost weight, dropping from 208 pounds to 188 pounds over three months, but has experienced a recent positive deflection in weight.  She has recently switched from taking the pill to using a patch and taking progesterone , which has affected her menstrual cycle, making it longer than before.     Challenges affecting patient  progress: none.    Pharmacotherapy for weight management: She is currently taking no anti-obesity medication.   Assessment and Plan   Treatment Plan For Obesity:  Recommended Dietary Goals  [name] is currently in the action stage of change. As such, her goal is to continue weight management plan. She has agreed to: incorporate prepackaged healthy meals for convenience and continue current plan  Behavioral Health and Counseling  We discussed the following behavioral modification strategies today: continue to work on maintaining a reduced calorie state, getting the recommended amount of protein, incorporating whole foods, making healthy choices, staying well hydrated and practicing mindfulness when eating. and increase protein intake, fibrous foods (25 grams per day for women, 30 grams for men) and water to improve satiety and decrease hunger signals. .  Additional education and resources provided today: Personalized instruction on the use of [employer] app to quickly assess nutritional value of prepackaged foods.  Recommended Physical Activity Goals  [name] has been advised to work up to 150 minutes of moderate intensity aerobic activity a week and strengthening exercises 2-3 times per week for cardiovascular health, weight loss maintenance and preservation of muscle mass.  She has agreed to :  Think about enjoyable ways to increase daily physical activity and overcoming barriers to exercise, Increase physical activity in their day and reduce sedentary time (increase NEAT)., Increase volume of physical activity to a goal of 240 minutes a week, and Combine aerobic and strengthening exercises for efficiency and improved cardiometabolic health.  Medical Interventions and Pharmacotherapy  We discussed various medication options to  help [name] with her weight loss efforts and we both agreed to : Continue with current nutritional and behavioral strategies  Associated Conditions Impacted by Obesity  Treatment  Assessment & Plan Primary hypertension Vitals:   12/14/24 0900  BP: 135/89    Blood pressure today slightly above target of less than 130/80.  This means some dietary changes and decrease physical activities which may have an impacted blood pressure.  She is currently on hydrochlorothiazide  1 tablet daily.  Continue current weight management strategy  Insulin  resistance Prediabetes Her HOMA-IR is 4.92 which is elevated. Optimal level < 1.9.   This is complex condition associated with genetics, ectopic fat and lifestyle factors. Insulin  resistance may result in increased fat storage, inhibition of the breakdown of fat, cause fluctuations in blood sugar leading to energy crashes and increased cravings for sugary or high carb foods and cause metabolic slowdown making it difficult to lose weight.  This may result in additional weight gain and lead to pre-diabetes and diabetes if untreated. In addition, hyperinsulinemia increases cardiovascular risk, chronic inflammatory response and may increase the risk of obesity related malignancies.  Her most recent A1c was down to 5.5 and improved  No results found for: HGBA1C Lab Results  Component Value Date   INSULIN  18.3 08/25/2024   Lab Results  Component Value Date   GLUCOSE 109 (H) 07/14/2024   GLUCOSE 79 11/28/2007   She will continue to work on reducing simple and added sugars in her diet and increasing physical activity.  She has declined pharmacoprophylaxis with metformin. Class 1 obesity with serious comorbidity and body mass index (BMI) of 30.0 to 30.9 in adult, unspecified obesity type Since starting program Weight: decrease of 19.2 lb (9.2%) over 3 months, 3 weeks  Start: 07/27/2024 208 lb 3.2 oz (94.4 kg)  End: 11/17/2024 189 lb (85.7 kg)  Weight loss over time Weight: decrease of 29 lb (13.3%) over 1 year, 6 months  Start: 04/30/2023 218 lb (98.9 kg)  End: 11/17/2024 189 lb (85.7 kg)  Management is ongoing with a  recent weight gain of two pounds, attributed to decreased physical activity and dietary lapses during the holidays. She is following a 1200 calorie target and has been exercising 85% of the time. The weight gain is not significant and is considered a variance rather than a plateau. She is aware of the body's protective mechanisms against weight loss, including increased hunger and slowed metabolism. She is confident in her ability to return to her weight loss trajectory. - Continue 1200 calorie target. - Incorporate regular physical activity. - Use tracking and journaling to realign with weight loss goals. - Consider using AI for meal planning and explore frozen meal options like Amy's and Kevin's. - Utilize the [employer] app to analyze food for additives and endocrine disruptor chemicals.          Objective   Physical Exam:  Blood pressure 135/89, pulse 86, temperature 98.3 F (36.8 C), height 5' 6.5 (1.689 m), weight 191 lb (86.6 kg), last menstrual period 11/22/2024, SpO2 100%. Body mass index is 30.37 kg/m.  General: She is overweight, cooperative, alert, well developed, and in no acute distress. PSYCH: Has normal mood, affect and thought process.   HEENT: EOMI, sclerae are anicteric. Lungs: Normal breathing effort, no conversational dyspnea. Extremities: No edema.  Neurologic: No gross sensory or motor deficits. No tremors or fasciculations noted.    Diagnostic Data Reviewed:  BMET    Component Value Date/Time   NA 137 07/14/2024 1716  K 3.5 07/14/2024 1716   CL 101 07/14/2024 1716   CO2 22 07/14/2024 1716   GLUCOSE 109 (H) 07/14/2024 1716   BUN 10 07/14/2024 1716   CREATININE 0.94 07/14/2024 1716   CALCIUM 10.2 07/14/2024 1716   GFRNONAA >60 07/14/2024 1716   GFRAA  01/05/2010 0740    >60        The eGFR has been calculated using the MDRD equation. This calculation has not been validated in all clinical situations. eGFR's persistently <60 mL/min signify possible  Chronic Kidney Disease.   No results found for: HGBA1C Lab Results  Component Value Date   INSULIN  18.3 08/25/2024   No results found for: TSH CBC    Component Value Date/Time   WBC 10.0 07/14/2024 1716   RBC 4.99 07/14/2024 1716   HGB 14.2 07/14/2024 1716   HCT 41.6 07/14/2024 1716   PLT 312 07/14/2024 1716   MCV 83.4 07/14/2024 1716   MCH 28.5 07/14/2024 1716   MCHC 34.1 07/14/2024 1716   RDW 13.2 07/14/2024 1716   Iron Studies No results found for: IRON, TIBC, FERRITIN, IRONPCTSAT Lipid Panel  No results found for: CHOL, TRIG, HDL, CHOLHDL, VLDL, LDLCALC, LDLDIRECT Hepatic Function Panel     Component Value Date/Time   PROT 5.9 (L) 12/07/2007 0505   ALBUMIN 3.0 (L) 12/07/2007 0505   AST 24 12/07/2007 0505   ALT 34 12/07/2007 0505   ALKPHOS 128 (H) 12/07/2007 0505   BILITOT 0.5 12/07/2007 0505   No results found for: TSH Nutritional Lab Results  Component Value Date   VD25OH 19.5 (L) 08/25/2024    Medications: Outpatient Encounter Medications as of 12/14/2024  Medication Sig   estradiol  (VIVELLE -DOT) 0.025 MG/24HR Place 1 patch onto the skin 2 (two) times a week.   fluticasone  (FLONASE ) 50 MCG/ACT nasal spray Place 1 spray into both nostrils 2 (two) times daily.   hydrochlorothiazide  (HYDRODIURIL ) 25 MG tablet Take 1 tablet (25 mg total) by mouth daily.   ibuprofen  (ADVIL ) 600 MG tablet Take 1 tablet (600 mg total) by mouth every 6 (six) hours as needed for mild pain.   progesterone  (PROMETRIUM ) 100 MG capsule Take 1 capsule (100 mg total) by mouth daily.   [DISCONTINUED] Vitamin D , Ergocalciferol , (DRISDOL ) 1.25 MG (50000 UNIT) CAPS capsule Take 1 capsule (50,000 Units total) by mouth every 7 (seven) days.   No facility-administered encounter medications on file as of 12/14/2024.     Follow-Up   Return in about 6 weeks (around 01/25/2025) for For Weight Mangement with Dr. Francyne.SABRA She was informed of the importance of frequent  follow up visits to maximize her success with intensive lifestyle modifications for her multiple health conditions.  Attestation Statement   Reviewed by clinician on day of visit: allergies, medications, problem list, medical history, surgical history, family history, social history, and previous encounter notes.     Lucas Francyne, MD

## 2024-12-14 NOTE — Assessment & Plan Note (Signed)
 Vitals:   12/14/24 0900  BP: 135/89    Blood pressure today slightly above target of less than 130/80.  This means some dietary changes and decrease physical activities which may have an impacted blood pressure.  She is currently on hydrochlorothiazide  1 tablet daily.  Continue current weight management strategy

## 2024-12-15 ENCOUNTER — Ambulatory Visit: Admitting: Radiology

## 2024-12-16 ENCOUNTER — Ambulatory Visit (INDEPENDENT_AMBULATORY_CARE_PROVIDER_SITE_OTHER): Admitting: Internal Medicine

## 2024-12-17 ENCOUNTER — Other Ambulatory Visit (HOSPITAL_COMMUNITY): Payer: Self-pay

## 2024-12-17 ENCOUNTER — Ambulatory Visit: Admitting: Radiology

## 2024-12-17 ENCOUNTER — Encounter: Payer: Self-pay | Admitting: Radiology

## 2024-12-17 DIAGNOSIS — N951 Menopausal and female climacteric states: Secondary | ICD-10-CM | POA: Diagnosis not present

## 2024-12-17 MED ORDER — ESTRADIOL 0.025 MG/24HR TD PTTW
1.0000 | MEDICATED_PATCH | TRANSDERMAL | 2 refills | Status: AC
  Filled 2024-12-17: qty 8, 28d supply, fill #0
  Filled 2025-01-09: qty 8, 28d supply, fill #1

## 2024-12-17 MED ORDER — PROGESTERONE MICRONIZED 100 MG PO CAPS
100.0000 mg | ORAL_CAPSULE | Freq: Every day | ORAL | 2 refills | Status: AC
  Filled 2024-12-17: qty 30, 30d supply, fill #0
  Filled 2025-01-09: qty 30, 30d supply, fill #1

## 2024-12-17 NOTE — Progress Notes (Signed)
" ° °  [name] 01-01-1976 [id number]   History:  48 y.o. G4P1 Here for HRT follow up on estradiol  0.025 mg patch and progesterone  100 mg---doing well, hot flashes resolved.   Gynecologic History Patient's last menstrual period was 11/22/2024.   Contraception/Family planning: OCP (estrogen/progesterone ) Sexually active: yes  Obstetric History OB History  Gravida Para Term Preterm AB Living  4 1 1  3 1   SAB IAB Ectopic Multiple Live Births  3    1    # Outcome Date GA Lbr Len/2nd Weight Sex Type Anes PTL Lv  4 SAB           3 SAB           2 SAB           1 Term               The following portions of the patient's history were reviewed and updated as appropriate: allergies, current medications, past family history, past medical history, past social history, past surgical history, and problem list.  Review of Systems  All other systems reviewed and are negative.   Past medical history, past surgical history, family history and social history were all reviewed and documented in the EPIC chart.  Exam:  Vitals:   12/17/24 1055  BP: 124/80  Pulse: 77  SpO2: 99%  Weight: 190 lb (86.2 kg)   Body mass index is 30.21 kg/m.  Physical Exam Constitutional:      Appearance: Normal appearance. She is normal weight.  Pulmonary:     Effort: Pulmonary effort is normal.  Neurological:     Mental Status: She is alert.  Psychiatric:        Mood and Affect: Mood normal.        Thought Content: Thought content normal.        Judgment: Judgment normal.      [name], CMA present for exam  Assessment/Plan:   1. Perimenopausal - estradiol  (VIVELLE -DOT) 0.025 MG/24HR; Place 1 patch onto the skin 2 (two) times a week.  Dispense: 24 patch; Refill: 2 - progesterone  (PROMETRIUM ) 100 MG capsule; Take 1 capsule (100 mg total) by mouth daily.  Dispense: 90 capsule; Refill: 2    AEX due 7/206  [name] WHNP-BC 11:12 AM 12/17/2024 "

## 2024-12-20 ENCOUNTER — Other Ambulatory Visit (HOSPITAL_COMMUNITY): Payer: Self-pay

## 2025-01-10 ENCOUNTER — Other Ambulatory Visit (HOSPITAL_COMMUNITY): Payer: Self-pay

## 2025-01-10 ENCOUNTER — Other Ambulatory Visit: Payer: Self-pay

## 2025-01-11 ENCOUNTER — Other Ambulatory Visit (HOSPITAL_COMMUNITY): Payer: Self-pay

## 2025-01-14 ENCOUNTER — Encounter (INDEPENDENT_AMBULATORY_CARE_PROVIDER_SITE_OTHER): Payer: Self-pay | Admitting: Internal Medicine

## 2025-01-19 ENCOUNTER — Ambulatory Visit (INDEPENDENT_AMBULATORY_CARE_PROVIDER_SITE_OTHER): Admitting: Internal Medicine

## 2025-01-26 ENCOUNTER — Other Ambulatory Visit (HOSPITAL_COMMUNITY): Payer: Self-pay

## 2025-01-26 ENCOUNTER — Telehealth: Admitting: Nurse Practitioner

## 2025-01-26 DIAGNOSIS — B9689 Other specified bacterial agents as the cause of diseases classified elsewhere: Secondary | ICD-10-CM

## 2025-01-26 DIAGNOSIS — J019 Acute sinusitis, unspecified: Secondary | ICD-10-CM

## 2025-01-26 MED ORDER — AMOXICILLIN-POT CLAVULANATE 875-125 MG PO TABS
1.0000 | ORAL_TABLET | Freq: Two times a day (BID) | ORAL | 0 refills | Status: AC
  Filled 2025-01-26: qty 14, 7d supply, fill #0

## 2025-01-26 NOTE — Progress Notes (Signed)
"      E-Visit for Sinus Problems  We are sorry that you are not feeling well.  Here is how we plan to help!  Based on what you have shared with me it looks like you have sinusitis.  Sinusitis is inflammation and infection in the sinus cavities of the head.  Based on your presentation I believe you most likely have Acute Bacterial Sinusitis.  This is an infection caused by bacteria and is treated with antibiotics. I have prescribed Augmentin  875mg /125mg  one tablet twice daily with food, for 7 days. You may use an oral decongestant such as Mucinex D or if you have glaucoma or high blood pressure use plain Mucinex. Saline nasal spray help and can safely be used as often as needed for congestion.  If you develop worsening sinus pain, fever or notice severe headache and vision changes, or if symptoms are not better after completion of antibiotic, please schedule an appointment with a health care provider.    Sinus infections are not as easily transmitted as other respiratory infection, however we still recommend that you avoid close contact with loved ones, especially the very young and elderly.  Remember to wash your hands thoroughly throughout the day as this is the number one way to prevent the spread of infection!  Home Care: Only take medications as instructed by your medical team. Complete the entire course of an antibiotic. Do not take these medications with alcohol . A steam or ultrasonic humidifier can help congestion.  You can place a towel over your head and breathe in the steam from hot water coming from a faucet. Avoid close contacts especially the very young and the elderly. Cover your mouth when you cough or sneeze. Always remember to wash your hands.  Get Help Right Away If: You develop worsening fever or sinus pain. You develop a severe head ache or visual changes. Your symptoms persist after you have completed your treatment plan.  Make sure you Understand these instructions. Will  watch your condition. Will get help right away if you are not doing well or get worse.  Your e-visit answers were reviewed by a board certified advanced clinical practitioner to complete your personal care plan.  Depending on the condition, your plan could have included both over the counter or prescription medications.  If there is a problem please reply  once you have received a response from your provider.  Your safety is important to us .  If you have drug allergies check your prescription carefully.    You can use MyChart to ask questions about todays visit, request a non-urgent call back, or ask for a work or school excuse for 24 hours related to this e-Visit. If it has been greater than 24 hours you will need to follow up with your provider, or enter a new e-Visit to address those concerns.  You will get an e-mail in the next two days asking about your experience.  I hope that your e-visit has been valuable and will speed your recovery. Thank you for using e-visits.  I have spent 5 minutes in review of e-visit questionnaire, review and updating patient chart, medical decision making and response to patient.   Hadassah Fireman, NP     "

## 2025-03-02 ENCOUNTER — Ambulatory Visit (INDEPENDENT_AMBULATORY_CARE_PROVIDER_SITE_OTHER): Admitting: Internal Medicine

## 2025-07-28 ENCOUNTER — Ambulatory Visit: Admitting: Radiology
